# Patient Record
Sex: Female | Born: 1945 | Race: White | Hispanic: No | Marital: Married | Smoking: Never smoker
Health system: Southern US, Community
[De-identification: ages and names within clinical notes are randomized; demographics above are authoritative.]

## PROBLEM LIST (undated history)

## (undated) DIAGNOSIS — E7800 Pure hypercholesterolemia, unspecified: Secondary | ICD-10-CM

## (undated) DIAGNOSIS — I10 Essential (primary) hypertension: Secondary | ICD-10-CM

## (undated) HISTORY — PX: TUBAL LIGATION: SHX77

## (undated) HISTORY — PX: CHOLECYSTECTOMY: SHX55

## (undated) HISTORY — PX: EYE SURGERY: SHX253

## (undated) HISTORY — PX: KNEE ARTHROSCOPY: SUR90

---

## 1998-09-27 ENCOUNTER — Encounter: Payer: Self-pay | Admitting: Obstetrics & Gynecology

## 1998-09-27 ENCOUNTER — Ambulatory Visit (HOSPITAL_COMMUNITY): Admission: RE | Admit: 1998-09-27 | Discharge: 1998-09-27 | Payer: Self-pay | Admitting: Obstetrics & Gynecology

## 2000-10-18 ENCOUNTER — Encounter: Payer: Self-pay | Admitting: Family Medicine

## 2000-10-18 ENCOUNTER — Ambulatory Visit (HOSPITAL_COMMUNITY): Admission: RE | Admit: 2000-10-18 | Discharge: 2000-10-18 | Payer: Self-pay | Admitting: Family Medicine

## 2001-08-20 ENCOUNTER — Encounter (INDEPENDENT_AMBULATORY_CARE_PROVIDER_SITE_OTHER): Payer: Self-pay | Admitting: Specialist

## 2001-08-20 ENCOUNTER — Ambulatory Visit (HOSPITAL_COMMUNITY): Admission: RE | Admit: 2001-08-20 | Discharge: 2001-08-20 | Payer: Self-pay | Admitting: Gastroenterology

## 2002-10-28 ENCOUNTER — Encounter: Payer: Self-pay | Admitting: Family Medicine

## 2002-10-28 ENCOUNTER — Ambulatory Visit (HOSPITAL_COMMUNITY): Admission: RE | Admit: 2002-10-28 | Discharge: 2002-10-28 | Payer: Self-pay | Admitting: Family Medicine

## 2004-06-21 ENCOUNTER — Ambulatory Visit: Payer: Self-pay | Admitting: Family Medicine

## 2004-08-11 ENCOUNTER — Ambulatory Visit: Payer: Self-pay | Admitting: Family Medicine

## 2004-09-27 ENCOUNTER — Ambulatory Visit: Payer: Self-pay | Admitting: Family Medicine

## 2004-11-08 ENCOUNTER — Ambulatory Visit (HOSPITAL_COMMUNITY): Admission: RE | Admit: 2004-11-08 | Discharge: 2004-11-08 | Payer: Self-pay

## 2004-12-06 ENCOUNTER — Ambulatory Visit: Payer: Self-pay | Admitting: Family Medicine

## 2004-12-08 ENCOUNTER — Ambulatory Visit (HOSPITAL_COMMUNITY): Admission: RE | Admit: 2004-12-08 | Discharge: 2004-12-08 | Payer: Self-pay | Admitting: Family Medicine

## 2005-04-20 ENCOUNTER — Ambulatory Visit: Payer: Self-pay | Admitting: Family Medicine

## 2005-11-07 ENCOUNTER — Ambulatory Visit: Payer: Self-pay | Admitting: Family Medicine

## 2006-01-11 ENCOUNTER — Ambulatory Visit (HOSPITAL_COMMUNITY): Admission: RE | Admit: 2006-01-11 | Discharge: 2006-01-11 | Payer: Self-pay | Admitting: Family Medicine

## 2006-03-13 ENCOUNTER — Ambulatory Visit: Payer: Self-pay | Admitting: Family Medicine

## 2006-05-15 ENCOUNTER — Ambulatory Visit: Payer: Self-pay | Admitting: Family Medicine

## 2006-07-24 ENCOUNTER — Ambulatory Visit: Payer: Self-pay | Admitting: Family Medicine

## 2006-08-06 ENCOUNTER — Ambulatory Visit (HOSPITAL_COMMUNITY): Admission: RE | Admit: 2006-08-06 | Discharge: 2006-08-06 | Payer: Self-pay | Admitting: Family Medicine

## 2006-09-19 ENCOUNTER — Ambulatory Visit: Payer: Self-pay | Admitting: Family Medicine

## 2007-02-11 ENCOUNTER — Ambulatory Visit (HOSPITAL_COMMUNITY): Admission: RE | Admit: 2007-02-11 | Discharge: 2007-02-11 | Payer: Self-pay | Admitting: Family Medicine

## 2008-03-02 ENCOUNTER — Ambulatory Visit (HOSPITAL_COMMUNITY): Admission: RE | Admit: 2008-03-02 | Discharge: 2008-03-02 | Payer: Self-pay | Admitting: Family Medicine

## 2010-09-22 ENCOUNTER — Other Ambulatory Visit (HOSPITAL_COMMUNITY): Payer: Self-pay | Admitting: Family Medicine

## 2010-09-22 ENCOUNTER — Ambulatory Visit (HOSPITAL_COMMUNITY)
Admission: RE | Admit: 2010-09-22 | Discharge: 2010-09-22 | Disposition: A | Discharge status: Home / Self Care | Payer: MEDICARE | Source: Ambulatory Visit | Attending: Family Medicine | Admitting: Family Medicine

## 2010-09-22 DIAGNOSIS — M6520 Calcific tendinitis, unspecified site: Secondary | ICD-10-CM | POA: Insufficient documentation

## 2010-09-22 DIAGNOSIS — R52 Pain, unspecified: Secondary | ICD-10-CM

## 2010-09-22 DIAGNOSIS — M898X9 Other specified disorders of bone, unspecified site: Secondary | ICD-10-CM | POA: Insufficient documentation

## 2010-09-22 DIAGNOSIS — M25469 Effusion, unspecified knee: Secondary | ICD-10-CM | POA: Insufficient documentation

## 2010-09-22 DIAGNOSIS — M1710 Unilateral primary osteoarthritis, unspecified knee: Secondary | ICD-10-CM | POA: Insufficient documentation

## 2010-09-28 ENCOUNTER — Other Ambulatory Visit (HOSPITAL_COMMUNITY): Payer: Self-pay | Admitting: Family Medicine

## 2010-09-28 DIAGNOSIS — M25469 Effusion, unspecified knee: Secondary | ICD-10-CM

## 2010-09-30 ENCOUNTER — Ambulatory Visit (HOSPITAL_COMMUNITY)
Admission: RE | Admit: 2010-09-30 | Discharge: 2010-09-30 | Disposition: A | Discharge status: Home / Self Care | Payer: MEDICARE | Source: Ambulatory Visit | Attending: Family Medicine | Admitting: Family Medicine

## 2010-09-30 DIAGNOSIS — M79609 Pain in unspecified limb: Secondary | ICD-10-CM | POA: Insufficient documentation

## 2010-09-30 DIAGNOSIS — X58XXXA Exposure to other specified factors, initial encounter: Secondary | ICD-10-CM | POA: Insufficient documentation

## 2010-09-30 DIAGNOSIS — IMO0002 Reserved for concepts with insufficient information to code with codable children: Secondary | ICD-10-CM | POA: Insufficient documentation

## 2010-09-30 DIAGNOSIS — M25569 Pain in unspecified knee: Secondary | ICD-10-CM | POA: Insufficient documentation

## 2010-09-30 DIAGNOSIS — M25469 Effusion, unspecified knee: Secondary | ICD-10-CM | POA: Insufficient documentation

## 2010-09-30 DIAGNOSIS — M1710 Unilateral primary osteoarthritis, unspecified knee: Secondary | ICD-10-CM | POA: Insufficient documentation

## 2010-09-30 DIAGNOSIS — M659 Unspecified synovitis and tenosynovitis, unspecified site: Secondary | ICD-10-CM | POA: Insufficient documentation

## 2010-12-02 NOTE — Procedures (Signed)
[facility]  Patient:    Megan Moses, Megan Moses Visit Number: [id number] MRN: [id number]          Service Type: END Location: ENDO Attending Physician:  Louie Bun Dictated by:   Everardo All Madilyn Fireman, M.D. Proc. Date: 08/20/01 Admit Date:  08/20/2001   CC:         Delaney Meigs, M.D.   Procedure Report  PROCEDURE:  Colonoscopy.  INDICATION FOR PROCEDURE:  Screening colonoscopy.  DESCRIPTION OF PROCEDURE:  The patient was placed in the left lateral decubitus position then placed on the pulse monitor with continuous low flow oxygen delivered by nasal cannula. She was sedated with 60 mg IV Demerol and 6 mg IV Versed. The Olympus video colonoscope was inserted into the rectum and advanced to the cecum, confirmed by transillumination at McBurneys point and visualization of the ileocecal valve and appendiceal orifice. The prep was excellent. Within the base of the cecum, there was a flat polyp approximately 8 mm in diameter and I elected to fulgurate this with the hot biopsy forceps without taking tissue. All visible elements did appear to be fulgurated. The remainder of the cecum and ascending colon appeared normal. Within the transverse colon, there was an 8 mm polyp which was fulgurated by hot biopsy slightly distal to this also thought to probably be in the transverse colon. There was a 1 cm sessile polyp removed by snare. The descending sigmoid and rectum appeared normal. The scope was then withdrawn and the patient returned to the recovery room in stable condition. The patient tolerated the procedure well and there were no immediate complications.  IMPRESSION:  Cecal and transverse colon polyps.  PLAN:  Await histology for determination of method and interval for future colon screening. Dictated by:   Everardo All Madilyn Fireman, M.D. Attending Physician:  Louie Bun DD:  08/20/01 TD:  08/21/01 Job: [id number]

## 2010-12-02 NOTE — Op Note (Signed)
NAMECATALEIA, GADE                  ACCOUNT NO.:  [id number]   MEDICAL RECORD NO.:  [id number]          PATIENT TYPE:  AMB   LOCATION:  ENDO                         FACILITY:  [facility]   PHYSICIAN:  John C. Madilyn Fireman, M.D.    DATE OF BIRTH:  1945-12-15   DATE OF PROCEDURE:  11/08/2004  DATE OF DISCHARGE:                                 OPERATIVE REPORT   PROCEDURE:  Colonoscopy.   INDICATIONS FOR PROCEDURE:  Adenomatous colon polyps three years ago.   DESCRIPTION OF PROCEDURE:  The patient was placed in the left lateral  decubitus position and placed on the pulse monitor with continuous low-flow  oxygen delivered by nasal cannula.  She was sedated with 37.5 mcg IV  fentanyl and 6 mg IV Versed.  The Olympus video colonoscope was inserted  into the rectum and advanced to the cecum, confirmed by transillumination of  McBurney's point and visualization of the ileocecal valve and appendiceal  orifice.  Prep was good.  The cecum, ascending, transverse, descending,  sigmoid, and rectum colon all appeared normal with no masses, polyps,  diverticula, or other mucosal abnormalities.  Retroflexed view of the anus  revealed no obvious internal hemorrhoids.  The scope was then withdrawn and  the patient returned to the recovery room in stable condition.  She  tolerated the procedure well and there were no immediate complications.   IMPRESSION:  Normal colonoscopy.   PLAN:  Repeat study in five years.      JCH/MEDQ  D:  11/08/2004  T:  11/08/2004  Job:  [id number]   cc:   Delaney Meigs, M.D.  [address]  [place]  [place] [address]  Fax: [phone]

## 2010-12-22 ENCOUNTER — Other Ambulatory Visit (HOSPITAL_COMMUNITY): Payer: Self-pay | Admitting: Family Medicine

## 2010-12-22 DIAGNOSIS — Z1231 Encounter for screening mammogram for malignant neoplasm of breast: Secondary | ICD-10-CM

## 2011-01-02 ENCOUNTER — Ambulatory Visit (HOSPITAL_COMMUNITY)
Admission: RE | Admit: 2011-01-02 | Discharge: 2011-01-02 | Disposition: A | Discharge status: Home / Self Care | Payer: Medicare Other | Source: Ambulatory Visit | Attending: Family Medicine | Admitting: Family Medicine

## 2011-01-02 DIAGNOSIS — Z1231 Encounter for screening mammogram for malignant neoplasm of breast: Secondary | ICD-10-CM | POA: Insufficient documentation

## 2011-07-14 ENCOUNTER — Other Ambulatory Visit (HOSPITAL_COMMUNITY): Payer: Self-pay | Admitting: Family Medicine

## 2011-07-14 DIAGNOSIS — Z1231 Encounter for screening mammogram for malignant neoplasm of breast: Secondary | ICD-10-CM

## 2011-07-25 ENCOUNTER — Ambulatory Visit (HOSPITAL_COMMUNITY): Payer: Medicare Other

## 2011-07-31 ENCOUNTER — Ambulatory Visit (HOSPITAL_COMMUNITY)
Admission: RE | Admit: 2011-07-31 | Discharge: 2011-07-31 | Disposition: A | Discharge status: Home / Self Care | Payer: Medicare Other | Source: Ambulatory Visit | Attending: Family Medicine | Admitting: Family Medicine

## 2011-07-31 DIAGNOSIS — E559 Vitamin D deficiency, unspecified: Secondary | ICD-10-CM | POA: Insufficient documentation

## 2011-07-31 DIAGNOSIS — Z13820 Encounter for screening for osteoporosis: Secondary | ICD-10-CM | POA: Insufficient documentation

## 2012-02-27 ENCOUNTER — Ambulatory Visit (HOSPITAL_COMMUNITY)
Admission: RE | Admit: 2012-02-27 | Discharge: 2012-02-27 | Disposition: A | Discharge status: Home / Self Care | Payer: Medicare Other | Source: Ambulatory Visit | Attending: Family Medicine | Admitting: Family Medicine

## 2012-02-27 DIAGNOSIS — Z1231 Encounter for screening mammogram for malignant neoplasm of breast: Secondary | ICD-10-CM | POA: Insufficient documentation

## 2013-01-14 ENCOUNTER — Other Ambulatory Visit (HOSPITAL_COMMUNITY): Payer: Self-pay | Admitting: Family Medicine

## 2013-01-14 DIAGNOSIS — M810 Age-related osteoporosis without current pathological fracture: Secondary | ICD-10-CM

## 2013-01-20 ENCOUNTER — Ambulatory Visit (HOSPITAL_COMMUNITY)
Admission: RE | Admit: 2013-01-20 | Discharge: 2013-01-20 | Disposition: A | Discharge status: Home / Self Care | Payer: Medicare Other | Source: Ambulatory Visit | Attending: Family Medicine | Admitting: Family Medicine

## 2013-01-20 DIAGNOSIS — M810 Age-related osteoporosis without current pathological fracture: Secondary | ICD-10-CM

## 2013-01-29 ENCOUNTER — Other Ambulatory Visit (HOSPITAL_COMMUNITY): Payer: Self-pay | Admitting: Family Medicine

## 2013-01-29 DIAGNOSIS — Z1231 Encounter for screening mammogram for malignant neoplasm of breast: Secondary | ICD-10-CM

## 2013-03-03 ENCOUNTER — Ambulatory Visit (HOSPITAL_COMMUNITY)
Admission: RE | Admit: 2013-03-03 | Discharge: 2013-03-03 | Disposition: A | Discharge status: Home / Self Care | Payer: Medicare Other | Source: Ambulatory Visit | Attending: Family Medicine | Admitting: Family Medicine

## 2013-03-03 DIAGNOSIS — Z1231 Encounter for screening mammogram for malignant neoplasm of breast: Secondary | ICD-10-CM | POA: Insufficient documentation

## 2014-06-24 ENCOUNTER — Other Ambulatory Visit (HOSPITAL_COMMUNITY): Payer: Self-pay | Admitting: Family Medicine

## 2014-06-24 DIAGNOSIS — Z780 Asymptomatic menopausal state: Secondary | ICD-10-CM

## 2014-06-24 DIAGNOSIS — Z1231 Encounter for screening mammogram for malignant neoplasm of breast: Secondary | ICD-10-CM

## 2014-07-23 ENCOUNTER — Ambulatory Visit (HOSPITAL_COMMUNITY)
Admission: RE | Admit: 2014-07-23 | Discharge: 2014-07-23 | Disposition: A | Discharge status: Home / Self Care | Payer: Commercial Managed Care - HMO | Source: Ambulatory Visit | Attending: Family Medicine | Admitting: Family Medicine

## 2014-07-23 DIAGNOSIS — Z1231 Encounter for screening mammogram for malignant neoplasm of breast: Secondary | ICD-10-CM | POA: Insufficient documentation

## 2014-07-23 DIAGNOSIS — Z13820 Encounter for screening for osteoporosis: Secondary | ICD-10-CM | POA: Diagnosis present

## 2014-07-23 DIAGNOSIS — Z780 Asymptomatic menopausal state: Secondary | ICD-10-CM | POA: Diagnosis not present

## 2014-07-23 DIAGNOSIS — R928 Other abnormal and inconclusive findings on diagnostic imaging of breast: Secondary | ICD-10-CM | POA: Diagnosis not present

## 2014-07-27 ENCOUNTER — Other Ambulatory Visit: Payer: Self-pay | Admitting: Family Medicine

## 2014-07-27 DIAGNOSIS — R928 Other abnormal and inconclusive findings on diagnostic imaging of breast: Secondary | ICD-10-CM

## 2014-08-10 ENCOUNTER — Ambulatory Visit
Admission: RE | Admit: 2014-08-10 | Discharge: 2014-08-10 | Disposition: A | Discharge status: Home / Self Care | Payer: Medicare HMO | Source: Ambulatory Visit | Attending: Family Medicine | Admitting: Family Medicine

## 2014-08-10 DIAGNOSIS — R928 Other abnormal and inconclusive findings on diagnostic imaging of breast: Secondary | ICD-10-CM

## 2014-12-23 ENCOUNTER — Other Ambulatory Visit: Payer: Self-pay | Admitting: Gastroenterology

## 2015-12-28 ENCOUNTER — Other Ambulatory Visit: Payer: Self-pay | Admitting: Family Medicine

## 2015-12-28 DIAGNOSIS — Z1231 Encounter for screening mammogram for malignant neoplasm of breast: Secondary | ICD-10-CM

## 2016-01-10 ENCOUNTER — Ambulatory Visit
Admission: RE | Admit: 2016-01-10 | Discharge: 2016-01-10 | Disposition: A | Discharge status: Home / Self Care | Payer: Medicare HMO | Source: Ambulatory Visit | Attending: Family Medicine | Admitting: Family Medicine

## 2016-01-10 DIAGNOSIS — Z1231 Encounter for screening mammogram for malignant neoplasm of breast: Secondary | ICD-10-CM

## 2016-12-27 ENCOUNTER — Other Ambulatory Visit: Payer: Self-pay | Admitting: Family Medicine

## 2016-12-27 DIAGNOSIS — Z1231 Encounter for screening mammogram for malignant neoplasm of breast: Secondary | ICD-10-CM

## 2017-01-02 ENCOUNTER — Other Ambulatory Visit: Payer: Self-pay | Admitting: Family Medicine

## 2017-01-02 DIAGNOSIS — E2839 Other primary ovarian failure: Secondary | ICD-10-CM

## 2017-01-10 ENCOUNTER — Ambulatory Visit
Admission: RE | Admit: 2017-01-10 | Discharge: 2017-01-10 | Disposition: A | Discharge status: Home / Self Care | Payer: Medicare HMO | Source: Ambulatory Visit | Attending: Family Medicine | Admitting: Family Medicine

## 2017-01-10 DIAGNOSIS — Z1231 Encounter for screening mammogram for malignant neoplasm of breast: Secondary | ICD-10-CM

## 2017-01-10 DIAGNOSIS — E2839 Other primary ovarian failure: Secondary | ICD-10-CM

## 2017-04-30 ENCOUNTER — Encounter (HOSPITAL_COMMUNITY): Payer: Self-pay | Admitting: Emergency Medicine

## 2017-04-30 ENCOUNTER — Emergency Department (HOSPITAL_COMMUNITY): Payer: Medicare HMO

## 2017-04-30 DIAGNOSIS — R51 Headache: Secondary | ICD-10-CM | POA: Insufficient documentation

## 2017-04-30 DIAGNOSIS — R0789 Other chest pain: Secondary | ICD-10-CM | POA: Diagnosis not present

## 2017-04-30 DIAGNOSIS — I10 Essential (primary) hypertension: Secondary | ICD-10-CM | POA: Insufficient documentation

## 2017-04-30 LAB — COMPREHENSIVE METABOLIC PANEL
ALBUMIN: 4.1 g/dL (ref 3.5–5.0)
ALK PHOS: 74 U/L (ref 38–126)
ALT: 21 U/L (ref 14–54)
ANION GAP: 10 (ref 5–15)
AST: 30 U/L (ref 15–41)
BUN: 8 mg/dL (ref 6–20)
CALCIUM: 9.7 mg/dL (ref 8.9–10.3)
CHLORIDE: 104 mmol/L (ref 101–111)
CO2: 26 mmol/L (ref 22–32)
Creatinine, Ser: 0.83 mg/dL (ref 0.44–1.00)
GFR calc non Af Amer: >60 mL/min (ref >60)
GLUCOSE: 121 mg/dL — AB (ref 65–99)
POTASSIUM: 3.4 mmol/L — AB (ref 3.5–5.1)
SODIUM: 140 mmol/L (ref 135–145)
Total Bilirubin: 0.9 mg/dL (ref 0.3–1.2)
Total Protein: 6.9 g/dL (ref 6.5–8.1)

## 2017-04-30 LAB — CBC WITH DIFFERENTIAL/PLATELET
BASOS PCT: 0 %
Basophils Absolute: 0 10*3/uL (ref 0.0–0.1)
EOS ABS: 0.2 10*3/uL (ref 0.0–0.7)
EOS PCT: 2 %
HCT: 43.4 % (ref 36.0–46.0)
Hemoglobin: 13.9 g/dL (ref 12.0–15.0)
LYMPHS ABS: 4.2 10*3/uL — AB (ref 0.7–4.0)
Lymphocytes Relative: 40 %
MCH: 29.3 pg (ref 26.0–34.0)
MCHC: 32 g/dL (ref 30.0–36.0)
MCV: 91.4 fL (ref 78.0–100.0)
MONO ABS: 0.8 10*3/uL (ref 0.1–1.0)
MONOS PCT: 7 %
NEUTROS PCT: 51 %
Neutro Abs: 5.5 10*3/uL (ref 1.7–7.7)
PLATELETS: 288 10*3/uL (ref 150–400)
RBC: 4.75 MIL/uL (ref 3.87–5.11)
RDW: 13.4 % (ref 11.5–15.5)
WBC: 10.7 10*3/uL — ABNORMAL HIGH (ref 4.0–10.5)

## 2017-04-30 LAB — I-STAT TROPONIN, ED: Troponin i, poc: 0 ng/mL (ref 0.00–0.08)

## 2017-04-30 NOTE — ED Triage Notes (Signed)
Pt c/o HTN  St's she was seen today by her MD and was told to take 2 B/P meds which she did.  Pt also c/o chest feeling tight.

## 2017-05-01 ENCOUNTER — Emergency Department (HOSPITAL_COMMUNITY)
Admission: EM | Admit: 2017-05-01 | Discharge: 2017-05-01 | Disposition: A | Discharge status: Home / Self Care | Payer: Medicare HMO | Attending: Emergency Medicine | Admitting: Emergency Medicine

## 2017-05-01 DIAGNOSIS — I10 Essential (primary) hypertension: Secondary | ICD-10-CM

## 2017-05-01 HISTORY — DX: Pure hypercholesterolemia, unspecified: E78.00

## 2017-05-01 NOTE — Discharge Instructions (Signed)
As we discussed, your lab work today look good. Try not to repeatedly check your blood pressure during the day. Check once or twice daily, preferably at the same time. Follow-up closely with your primary care doctor. Return here for any new or worsening symptoms.

## 2017-05-01 NOTE — ED Provider Notes (Signed)
Patient presented to the ER with hypertension. Patient recently diagnosed with hypertension by her primary doctor, started on Norvasc and hydrochlorothiazide. She was seen yesterday with persistent blood pressure issues, her doctor doubled her Norvasc. Tonight she had elevated blood pressure once again, 230 systolic at home. She is very anxious.  Face to face Exam: HEENT - PERRLA Lungs - CTAB Heart - RRR, no M/R/G Abd - S/NT/ND Neuro - alert, oriented x3  Plan: Workup unremarkable. Blood pressure somewhat improved. Patient reassured, likely an anxiety component as well as essential hypertension. She will take her medications as prescribed, check her blood pressure only 1 or 2 times a day and has follow-up scheduled. She will follow up with primary doctor by phone in the morning.   Gilda Crease, MD 05/01/17 [phone]

## 2017-05-01 NOTE — ED Provider Notes (Signed)
[facility] EMERGENCY DEPARTMENT Provider Note   CSN: [id number] Arrival date & time: 04/30/17  2136     History   Chief Complaint No chief complaint on file.   HPI Megan Moses is a 71 y.o. female.  The history is provided by the patient and medical records.   71 year old female with history of hyperlipidemia, and newly diagnosed hypertension 4 days ago, presenting to the ED with elevated blood pressure. Patient reports last week at work she had a coworker check her blood pressure and noticed that it was in the 200s systolic. She called her doctor who encouraged her to come to the clinic. She was started on Norvasc 2.5mg .  States she's been checking her blood pressure several times a day, but feels like it was not coming down. She called her doctor again yesterday and was seen again in the clinic and told to double her dose of Norvasc. States she did this and continue checking her blood pressure always concerned when it was in going down so she wanted to be evaluated. States she does have a headache but denies any dizziness or confusion. No blurred vision. States she did notice some tightness in her chest, but has noticed that her chest is tender to palpation as well.  No fever/chills/cough.  No cardiac history.  She is not a smoker.  Patient does admit to being under some stress recently as she is the primary caregiver for her husband who has bipolar disorder and vascular dementia.  Past Medical History:  Diagnosis Date  . High cholesterol     There are no active problems to display for this patient.   Past Surgical History:  Procedure Laterality Date  . CHOLECYSTECTOMY    . EYE SURGERY    . TUBAL LIGATION      OB History    No data available       Home Medications    Prior to Admission medications   Not on File    Family History Family History  Problem Relation Age of Onset  . Breast cancer Sister     Social History Social History    Substance Use Topics  . Smoking status: Never Smoker  . Smokeless tobacco: Never Used  . Alcohol use No     Allergies   Patient has no allergy information on record.   Review of Systems Review of Systems  Respiratory: Positive for chest tightness.   Neurological: Positive for headaches.  All other systems reviewed and are negative.    Physical Exam Updated Vital Signs BP (!) 182/71 (BP Location: Left Arm)   Pulse 75   Temp 98.3 F (36.8 C) (Oral)   Resp 16   Ht 5' 3.5" (1.613 m)   Wt 77.6 kg (171 lb)   SpO2 97%   BMI 29.82 kg/m   Physical Exam  Constitutional: She is oriented to person, place, and time. She appears well-developed and well-nourished. No distress.  HENT:  Head: Normocephalic and atraumatic.  Right Ear: External ear normal.  Left Ear: External ear normal.  Eyes: Pupils are equal, round, and reactive to light. Conjunctivae and EOM are normal.  Neck: Normal range of motion and full passive range of motion without pain. Neck supple. No neck rigidity.  No rigidity, no meningismus  Cardiovascular: Normal rate, regular rhythm and normal heart sounds.   No murmur heard. Pulmonary/Chest: Effort normal and breath sounds normal. No respiratory distress. She has no wheezes. She has no rhonchi.  Abdominal:  Soft. Bowel sounds are normal. There is no tenderness. There is no guarding.  Musculoskeletal: Normal range of motion. She exhibits no edema.  Neurological: She is alert and oriented to person, place, and time. She has normal strength. She displays no tremor. No cranial nerve deficit or sensory deficit. She displays no seizure activity.  AAOx3, answering questions and following commands appropriately; equal strength UE and LE bilaterally; CN grossly intact; moves all extremities appropriately without ataxia; no focal neuro deficits or facial asymmetry appreciated  Skin: Skin is warm and dry. No rash noted. She is not diaphoretic.  Psychiatric: She has a normal  mood and affect. Her behavior is normal. Thought content normal.  Nursing note and vitals reviewed.    ED Treatments / Results  Labs (all labs ordered are listed, but only abnormal results are displayed) Labs Reviewed  CBC WITH DIFFERENTIAL/PLATELET - Abnormal; Notable for the following:       Result Value   WBC 10.7 (*)    Lymphs Abs 4.2 (*)    All other components within normal limits  COMPREHENSIVE METABOLIC PANEL - Abnormal; Notable for the following:    Potassium 3.4 (*)    Glucose, Bld 121 (*)    All other components within normal limits  I-STAT TROPONIN, ED    EKG  EKG Interpretation None       Radiology Dg Chest 2 View  Result Date: 04/30/2017 CLINICAL DATA:  71 y/o F; 5 days of high blood pressure and chest tightness. EXAM: CHEST  2 VIEW COMPARISON:  None. FINDINGS: The heart size and mediastinal contours are within normal limits. Both lungs are clear. The visualized skeletal structures are unremarkable. Right upper quadrant cholecystectomy surgical clips. IMPRESSION: No acute pulmonary process identified. Electronically Signed   By: Mitzi Hansen M.D.   On: 04/30/2017 22:36    Procedures Procedures (including critical care time)  Medications Ordered in ED Medications - No data to display   Initial Impression / Assessment and Plan / ED Course  I have reviewed the triage vital signs and the nursing notes.  Pertinent labs & imaging results that were available during my care of the patient were reviewed by me and considered in my medical decision making (see chart for details).  71 y.o. F here with HTN.  Seen by PCP x2 recently, started on new BP meds 4 days ago with dose increased yesterday.  She is hypertensive here but non-toxic in appearance.  No focal neurologic deficits.  Work-up here including labs, CXR, EKG overall reassuring.  No signs of end organ damage.  Discussed with aptient that it will likely take some time to for her body to adjust to  the meds, may need to be slowly titrated up.  She has been compulsively checking her BP at home-- recommended maximum checking 2x daily, preferably at the same time everyday.  She also expresses some anxiety/stress related symptoms which may be playing a role.  Will have her follow-up closely with her PCP.  Discussed plan with patient, he/she acknowledged understanding and agreed with plan of care.  Return precautions given for new or worsening symptoms.  Patient seen and evaluated with attending physician, Dr. Blinda Leatherwood, who agrees with assessment and plan of care.  Final Clinical Impressions(s) / ED Diagnoses   Final diagnoses:  Essential hypertension    New Prescriptions There are no discharge medications for this patient.    Garlon Hatchet, PA-C 05/01/17 1610    Gilda Crease, MD 05/01/17 [phone]

## 2017-06-27 ENCOUNTER — Other Ambulatory Visit: Payer: Self-pay

## 2017-06-27 ENCOUNTER — Emergency Department (HOSPITAL_COMMUNITY): Payer: Medicare HMO

## 2017-06-27 ENCOUNTER — Encounter (HOSPITAL_COMMUNITY): Payer: Self-pay | Admitting: *Deleted

## 2017-06-27 ENCOUNTER — Emergency Department (HOSPITAL_COMMUNITY)
Admission: EM | Admit: 2017-06-27 | Discharge: 2017-06-27 | Disposition: A | Discharge status: Home / Self Care | Payer: Medicare HMO | Attending: Emergency Medicine | Admitting: Emergency Medicine

## 2017-06-27 DIAGNOSIS — I10 Essential (primary) hypertension: Secondary | ICD-10-CM | POA: Diagnosis not present

## 2017-06-27 DIAGNOSIS — M1711 Unilateral primary osteoarthritis, right knee: Secondary | ICD-10-CM | POA: Diagnosis not present

## 2017-06-27 DIAGNOSIS — M25561 Pain in right knee: Secondary | ICD-10-CM | POA: Diagnosis present

## 2017-06-27 HISTORY — DX: Essential (primary) hypertension: I10

## 2017-06-27 MED ORDER — TRAMADOL HCL 50 MG PO TABS: ORAL_TABLET | ORAL | 0 refills | Status: DC

## 2017-06-27 MED ORDER — DICLOFENAC SODIUM 1 % TD GEL: TRANSDERMAL | 1 refills | Status: AC

## 2017-06-27 NOTE — ED Notes (Signed)
Pt back from x-ray.

## 2017-06-27 NOTE — ED Triage Notes (Signed)
Right knee pain

## 2017-06-27 NOTE — Discharge Instructions (Signed)
Your vital signs are within normal limits.  Your x-ray shows multiple areas of arthritis involving your knee.  There is no fluid or effusion in the joint.  Please use your knee sleeve when up and about.  Please use a cane or walker until seen by orthopedics to give you added support.  Please apply Voltaren to the knee 3 times daily.  Use Tylenol every 4 hours, use Ultram for more severe pain if needed.  Ultram may cause drowsiness, please use this medication with caution.  Please see Dr. Romeo AppleHarrison in the office for orthopedic evaluation and management of your knee.

## 2017-06-27 NOTE — ED Provider Notes (Signed)
[facility] EMERGENCY DEPARTMENT Provider Note   CSN: [id number] Arrival date & time: 06/27/17  1033     History   Chief Complaint Chief Complaint  Patient presents with  . Knee Pain    HPI Megan Moses is a 71 y.o. female.  The history is provided by the patient.  Knee Pain   This is a chronic (acute on chronic) problem. The problem occurs hourly. The problem has been gradually worsening. The pain is present in the right knee. The quality of the pain is described as aching. The pain is moderate. Associated symptoms include limited range of motion. Associated symptoms comments: Can't bend the right knee without severe pain. She can straighten the knee, but not bend the knee.. Exacerbated by: bending. She has tried cold for the symptoms. The treatment provided no relief. There has been no history of extremity trauma.    Past Medical History:  Diagnosis Date  . High cholesterol   . Hypertension     There are no active problems to display for this patient.   Past Surgical History:  Procedure Laterality Date  . CHOLECYSTECTOMY    . EYE SURGERY    . TUBAL LIGATION      OB History    No data available       Home Medications    Prior to Admission medications   Not on File    Family History Family History  Problem Relation Age of Onset  . Breast cancer Sister     Social History Social History   Tobacco Use  . Smoking status: Never Smoker  . Smokeless tobacco: Never Used  Substance Use Topics  . Alcohol use: No  . Drug use: No     Allergies   Sulfa antibiotics   Review of Systems Review of Systems  Constitutional: Negative for activity change.       All ROS Neg except as noted in HPI  HENT: Negative for nosebleeds.   Eyes: Negative for photophobia and discharge.  Respiratory: Negative for cough, shortness of breath and wheezing.   Cardiovascular: Negative for chest pain and palpitations.  Gastrointestinal: Negative for abdominal pain and blood  in stool.  Genitourinary: Negative for dysuria, frequency and hematuria.  Musculoskeletal: Positive for arthralgias. Negative for back pain and neck pain.  Skin: Negative.   Neurological: Negative for dizziness, seizures and speech difficulty.  Psychiatric/Behavioral: Negative for confusion and hallucinations.     Physical Exam Updated Vital Signs BP 133/73   Pulse 79   Temp 98.3 F (36.8 C)   Resp 20   Ht 5\' 3"  (1.6 m)   Wt 75.8 kg (167 lb)   SpO2 98%   BMI 29.58 kg/m   Physical Exam  Constitutional: She is oriented to person, place, and time. She appears well-developed and well-nourished.  Non-toxic appearance.  HENT:  Head: Normocephalic.  Right Ear: Tympanic membrane and external ear normal.  Left Ear: Tympanic membrane and external ear normal.  Eyes: EOM and lids are normal. Pupils are equal, round, and reactive to light.  Neck: Normal range of motion. Neck supple. Carotid bruit is not present.  Cardiovascular: Normal rate, regular rhythm, normal heart sounds, intact distal pulses and normal pulses.  Pulmonary/Chest: Breath sounds normal. No respiratory distress.  Abdominal: Soft. Bowel sounds are normal. There is no tenderness. There is no guarding.  Musculoskeletal:       Right knee: She exhibits decreased range of motion. She exhibits no deformity, no erythema and normal patellar  mobility. Tenderness found. Medial joint line tenderness noted.  Lymphadenopathy:       Head (right side): No submandibular adenopathy present.       Head (left side): No submandibular adenopathy present.    She has no cervical adenopathy.  Neurological: She is alert and oriented to person, place, and time. She has normal strength. No cranial nerve deficit or sensory deficit.  Skin: Skin is warm and dry.  Psychiatric: She has a normal mood and affect. Her speech is normal.  Nursing note and vitals reviewed.    ED Treatments / Results  Labs (all labs ordered are listed, but only  abnormal results are displayed) Labs Reviewed - No data to display  EKG  EKG Interpretation None       Radiology No results found.  Procedures Procedures (including critical care time)  Medications Ordered in ED Medications - No data to display   Initial Impression / Assessment and Plan / ED Course  I have reviewed the triage vital signs and the nursing notes.  Pertinent labs & imaging results that were available during my care of the patient were reviewed by me and considered in my medical decision making (see chart for details).       Final Clinical Impressions(s) / ED Diagnoses MDM Vital signs reviewed.  The patient's right knee is most painful with bending.  Seems to be better with straightening the knee out.  There is no effusion noted on the examination and no evidence of a hot knee. X-ray of the right knee shows osteophytes of multiple sites.  There is also joint space narrowing present.  I suspect that the pain is related to these findings.  The patient is fitted with a knee sleeve.  The patient will be treated with Voltaren gel and Ultram.  I have asked the patient to see Dr. Romeo AppleHarrison for orthopedic management as soon as possible.  I have also asked the patient to use a walker for stability until she can see Dr. Romeo AppleHarrison.   Final diagnoses:  Primary osteoarthritis of right knee    ED Discharge Orders        Ordered    diclofenac sodium (VOLTAREN) 1 % GEL     06/27/17 1319    traMADol (ULTRAM) 50 MG tablet     06/27/17 1319       Ivery QualeBryant, Chloris Marcoux, PA-C 06/27/17 1938    Samuel JesterMcManus, Kathleen, DO 06/29/17 1735

## 2017-06-27 NOTE — ED Notes (Signed)
Pt to xray

## 2017-07-04 ENCOUNTER — Encounter: Payer: Self-pay | Admitting: Orthopedic Surgery

## 2017-07-04 ENCOUNTER — Ambulatory Visit: Payer: Medicare HMO | Admitting: Orthopedic Surgery

## 2017-07-04 VITALS — BP 94/65 | HR 64 | Ht 63.0 in | Wt 167.0 lb

## 2017-07-04 DIAGNOSIS — M170 Bilateral primary osteoarthritis of knee: Secondary | ICD-10-CM

## 2017-07-04 MED ORDER — DICLOFENAC SODIUM 1 % TD GEL: 4.0000 g | Freq: Four times a day (QID) | TRANSDERMAL | 3 refills | Status: DC

## 2017-07-04 NOTE — Progress Notes (Signed)
Patient ID: Megan Moses, female   DOB: 1946-03-13, 71 y.o.   MRN: [id number]  Chief Complaint  Patient presents with  . Knee Pain    ER follow up Right knee pain since 06/26/17    71 year old female was putting her boot on and developed acute right knee pain.  She was in to see her doctor who sent her to the ER x-rays showed arthritis of the knee no acute fracture.  Pain was on the medial joint it was associated with decreased range of motion painful weightbearing.  She describes dull aching pain without catching locking or giving way.  She had pain for about 2-3 days and then it resolved using Voltaren gel.  She had an arthroscopy of the left knee with microfracture in 2012.  She said she was told she had bone-on-bone changes in the microfracture was done as a cartilage stimulation procedure and she has had minimal symptoms from that knee until the right knee started to hurt.  She had transfer pain onto the left knee which is better now that the right knee pain is improving    Review of Systems  Respiratory: Negative.   Cardiovascular: Negative.   Musculoskeletal: Negative for back pain.       Right leg shin pain currently resolved  Neurological: Negative for tingling.    Past Medical History:  Diagnosis Date  . High cholesterol   . Hypertension     Past Surgical History:  Procedure Laterality Date  . CHOLECYSTECTOMY    . EYE SURGERY    . TUBAL LIGATION      PHYSICAL EXAM  BP 94/65   Pulse 64   Ht 5\' 3"  (1.6 m)   Wt 167 lb (75.8 kg)   BMI 29.58 kg/m  GENERAL appearance reveals no gross abnormalities, normal development grooming and hygiene   MENTAL STATUS we note that the patient is awake alert and oriented to person place and time MOOD/AFFECT ARE NORMAL   GAIT reveals no  limp in the effected limb  Right Knee Exam   Muscle Strength  The patient has normal right knee strength.  Tenderness  The patient is experiencing tenderness in the medial joint  line.  Range of Motion  Extension: normal  Flexion: normal   Tests  McMurray:  Medial - negative Lateral - negative Varus: negative Valgus: negative Drawer:  Anterior - negative    Posterior - negative  Other  Erythema: absent Scars: absent Sensation: normal Pulse: present Swelling: none Effusion: no effusion present   Left Knee Exam   Muscle Strength  The patient has normal left knee strength.  Tenderness  The patient is experiencing tenderness in the medial joint line.  Range of Motion  Extension: normal  Flexion: normal Left knee flexion: 125 degrees.   Tests  McMurray:  Medial - negative Lateral - negative Varus: negative Valgus: negative Drawer:  Anterior - negative     Posterior - negative  Other  Erythema: absent Scars: absent Sensation: normal Pulse: present Swelling: none Effusion: no effusion present     VASC 2+ dorsalis pedis pulse normal capillary refill excellent warmth to the extremity  NEURO normal sensation and no pathologic reflexes  LYMPH deferred noncontributory   IMAGING STUDIES  I have independently reviewed the x-rays and I interpreted the x-rays as follows:  Right knee shows moderate amount of arthritis medial compartment alignment is normal.  Left knee from 2012 also shows arthritis medial compartment.  Dx   primary osteoarthritis of the  right and left knee  PLAN continue diclofenac cream call if any increase in symptoms  9:12 AM Fuller CanadaStanley Jaycee Mckellips, MD 07/04/2017

## 2017-09-10 ENCOUNTER — Ambulatory Visit: Payer: Medicare HMO | Admitting: Orthopedic Surgery

## 2017-09-10 ENCOUNTER — Encounter: Payer: Self-pay | Admitting: Orthopedic Surgery

## 2017-09-10 VITALS — BP 128/78 | HR 78 | Ht 63.0 in | Wt 168.0 lb

## 2017-09-10 DIAGNOSIS — M170 Bilateral primary osteoarthritis of knee: Secondary | ICD-10-CM

## 2017-09-10 NOTE — Progress Notes (Signed)
Chief Complaint  Patient presents with  . Knee Pain    right    Procedure note right knee injection verbal consent was obtained to inject right knee joint  Timeout was completed to confirm the site of injection  The medications used were 40 mg of Depo-Medrol and 1% lidocaine 3 cc  Anesthesia was provided by ethyl chloride and the skin was prepped with alcohol.  After cleaning the skin with alcohol a 20-gauge needle was used to inject the right knee joint. There were no complications. A sterile bandage was applied.  Encounter Diagnosis  Name Primary?  . Primary osteoarthritis of both knees Yes

## 2017-10-15 ENCOUNTER — Encounter: Payer: Self-pay | Admitting: Orthopedic Surgery

## 2017-10-15 ENCOUNTER — Ambulatory Visit (INDEPENDENT_AMBULATORY_CARE_PROVIDER_SITE_OTHER): Payer: Self-pay | Admitting: Orthopedic Surgery

## 2017-10-15 VITALS — BP 123/76 | HR 73 | Ht 63.0 in | Wt 167.0 lb

## 2017-10-15 DIAGNOSIS — M170 Bilateral primary osteoarthritis of knee: Secondary | ICD-10-CM

## 2017-10-15 NOTE — Progress Notes (Signed)
Chief Complaint  Patient presents with  . Knee Pain    right     Procedure note right knee injection verbal consent was obtained to inject right knee joint  Timeout was completed to confirm the site of injection  The medications used were 40 mg of Depo-Medrol and 1% lidocaine 3 cc  Anesthesia was provided by ethyl chloride and the skin was prepped with alcohol.  After cleaning the skin with alcohol a 20-gauge needle was used to inject the right knee joint. There were no complications. A sterile bandage was applied.  

## 2017-12-21 ENCOUNTER — Other Ambulatory Visit: Payer: Self-pay | Admitting: Family Medicine

## 2017-12-21 DIAGNOSIS — Z1231 Encounter for screening mammogram for malignant neoplasm of breast: Secondary | ICD-10-CM

## 2018-01-15 ENCOUNTER — Ambulatory Visit
Admission: RE | Admit: 2018-01-15 | Discharge: 2018-01-15 | Disposition: A | Discharge status: Home / Self Care | Payer: Medicare HMO | Source: Ambulatory Visit | Attending: Family Medicine | Admitting: Family Medicine

## 2018-01-15 DIAGNOSIS — Z1231 Encounter for screening mammogram for malignant neoplasm of breast: Secondary | ICD-10-CM

## 2018-11-27 ENCOUNTER — Telehealth: Payer: Self-pay | Admitting: Orthopedic Surgery

## 2018-11-27 NOTE — Telephone Encounter (Signed)
Patient called to request appointment for bilateral knee pain which has flared up - requests injections if possible. I offered appointment for early June due to covid-19 restrictions - patient said she is leaving town Wednesday, 12/04/18, to take care of grandchildren. Any possibility of in-person appointment before then?

## 2018-11-28 NOTE — Telephone Encounter (Signed)
See her 1140 may 18th, cancel joseph scales appointment for Dec 02 1138

## 2018-11-28 NOTE — Telephone Encounter (Signed)
Called patient to offer appointment per Dr Mort Sawyers response; reached voice mail; left message to return call.

## 2018-11-29 NOTE — Telephone Encounter (Signed)
Patient returned call; has been scheduled per Dr Mort Sawyers note. Aware of appointment.

## 2018-12-02 ENCOUNTER — Encounter: Payer: Self-pay | Admitting: Orthopedic Surgery

## 2018-12-02 ENCOUNTER — Ambulatory Visit: Payer: Medicare HMO | Admitting: Orthopedic Surgery

## 2018-12-02 ENCOUNTER — Other Ambulatory Visit: Payer: Self-pay

## 2018-12-02 VITALS — BP 151/79 | HR 75 | Temp 97.6°F | Ht 63.0 in | Wt 169.0 lb

## 2018-12-02 DIAGNOSIS — M170 Bilateral primary osteoarthritis of knee: Secondary | ICD-10-CM

## 2018-12-02 NOTE — Progress Notes (Signed)
Chief Complaint  Patient presents with  . Knee Pain    requested bilateral knee injections   Procedure note for bilateral knee injections  Procedure note left knee injection verbal consent was obtained to inject left knee joint  Timeout was completed to confirm the site of injection  The medications used were 40 mg of Depo-Medrol and 1% lidocaine 3 cc  Anesthesia was provided by ethyl chloride and the skin was prepped with alcohol.  After cleaning the skin with alcohol a 20-gauge needle was used to inject the left knee joint. There were no complications. A sterile bandage was applied.   Procedure note right knee injection verbal consent was obtained to inject right knee joint  Timeout was completed to confirm the site of injection  The medications used were 40 mg of Depo-Medrol and 1% lidocaine 3 cc  Anesthesia was provided by ethyl chloride and the skin was prepped with alcohol.  After cleaning the skin with alcohol a 20-gauge needle was used to inject the right knee joint. There were no complications. A sterile bandage was applied.  Encounter Diagnosis  Name Primary?  . Primary osteoarthritis of both knees Yes

## 2018-12-25 ENCOUNTER — Other Ambulatory Visit: Payer: Self-pay | Admitting: Family Medicine

## 2018-12-25 DIAGNOSIS — Z1231 Encounter for screening mammogram for malignant neoplasm of breast: Secondary | ICD-10-CM

## 2019-02-10 ENCOUNTER — Ambulatory Visit
Admission: RE | Admit: 2019-02-10 | Discharge: 2019-02-10 | Disposition: A | Discharge status: Home / Self Care | Payer: Medicare HMO | Source: Ambulatory Visit | Attending: Family Medicine | Admitting: Family Medicine

## 2019-02-10 ENCOUNTER — Other Ambulatory Visit: Payer: Self-pay

## 2019-02-10 DIAGNOSIS — Z1231 Encounter for screening mammogram for malignant neoplasm of breast: Secondary | ICD-10-CM

## 2020-01-21 ENCOUNTER — Other Ambulatory Visit: Payer: Self-pay | Admitting: Family Medicine

## 2020-01-21 DIAGNOSIS — Z1231 Encounter for screening mammogram for malignant neoplasm of breast: Secondary | ICD-10-CM

## 2020-02-13 ENCOUNTER — Other Ambulatory Visit: Payer: Self-pay

## 2020-02-13 ENCOUNTER — Ambulatory Visit
Admission: RE | Admit: 2020-02-13 | Discharge: 2020-02-13 | Disposition: A | Discharge status: Home / Self Care | Payer: Medicare HMO | Source: Ambulatory Visit | Attending: Family Medicine | Admitting: Family Medicine

## 2020-02-13 DIAGNOSIS — Z1231 Encounter for screening mammogram for malignant neoplasm of breast: Secondary | ICD-10-CM

## 2020-05-25 ENCOUNTER — Encounter: Payer: Self-pay | Admitting: Orthopaedic Surgery

## 2020-05-25 ENCOUNTER — Ambulatory Visit (INDEPENDENT_AMBULATORY_CARE_PROVIDER_SITE_OTHER): Payer: Medicare HMO | Admitting: Orthopaedic Surgery

## 2020-05-25 ENCOUNTER — Ambulatory Visit (INDEPENDENT_AMBULATORY_CARE_PROVIDER_SITE_OTHER): Payer: Medicare HMO

## 2020-05-25 ENCOUNTER — Other Ambulatory Visit: Payer: Self-pay

## 2020-05-25 ENCOUNTER — Ambulatory Visit: Payer: Self-pay

## 2020-05-25 VITALS — BP 145/79 | HR 80 | Ht 63.0 in | Wt 165.0 lb

## 2020-05-25 DIAGNOSIS — M25561 Pain in right knee: Secondary | ICD-10-CM

## 2020-05-25 DIAGNOSIS — M25562 Pain in left knee: Secondary | ICD-10-CM | POA: Diagnosis not present

## 2020-05-25 DIAGNOSIS — G8929 Other chronic pain: Secondary | ICD-10-CM | POA: Diagnosis not present

## 2020-05-25 NOTE — Progress Notes (Signed)
Office Visit Note   Patient: Megan Moses           Date of Birth: 01-11-1946           MRN: [id number] Visit Date: 05/25/2020              Requested by: Vivien Presto, MD [phone] [address] [place],  [place] [address] PCP: Vivien Presto, MD   Assessment & Plan: Visit Diagnoses:  1. Chronic pain of both knees     Plan: Bilateral knee injection performed which she tolerated well.  She can return on an as-needed basis.  Follow-Up Instructions: No follow-ups on file.   Orders:  Orders Placed This Encounter  Procedures  . XR Knee 1-2 Views Right  . XR KNEE 3 VIEW LEFT   No orders of the defined types were placed in this encounter.     Procedures: Large Joint Inj: R knee on 05/30/2020 5:11 PM Indications: pain and joint swelling Details: 22 G 1.5 in needle, anterolateral approach  Arthrogram: No  Medications: 40 mg methylPREDNISolone acetate 40 MG/ML; 0.5 mL lidocaine 1 %; 4 mL bupivacaine 0.25 % Outcome: tolerated well, no immediate complications Procedure, treatment alternatives, risks and benefits explained, specific risks discussed. Consent was given by the patient. Immediately prior to procedure a time out was called to verify the correct patient, procedure, equipment, support staff and site/side marked as required. Patient was prepped and draped in the usual sterile fashion.   Large Joint Inj: L knee on 05/30/2020 5:12 PM Indications: joint swelling and pain Details: 22 G 1.5 in needle, anterolateral approach  Arthrogram: No  Medications: 0.5 mL lidocaine 1 %; 3 mL bupivacaine 0.5 %; 40 mg methylPREDNISolone acetate 40 MG/ML Outcome: tolerated well, no immediate complications Procedure, treatment alternatives, risks and benefits explained, specific risks discussed. Consent was given by the patient. Immediately prior to procedure a time out was called to verify the correct patient, procedure, equipment, support staff and site/side marked as required.  Patient was prepped and draped in the usual sterile fashion.       Clinical Data: No additional findings.   Subjective: Chief Complaint  Patient presents with  . Right Knee - Pain  . Left Knee - Pain    HPI 74 year old female returns she had previous injections in her knee in April is requesting repeat injections.  She got many months of relief.  She states with the weather is getting colder she has had increased difficulty walking she is using forward Taryn gel on a daily basis.  Pain is kept her from falling asleep and sometimes been waking up in the middle the night.  Recently her husband passed away she has had to do yard work now which puts more demands on her knees and she requests repeat injections.  Previous knee arthroscopy in the left knee 2013 with debridement.  Patient has used Mobic.  Review of Systems all other systems are noncontributory to HPI.   Objective: Vital Signs: BP (!) 145/79   Pulse 80   Ht 5\' 3"  (1.6 m)   Wt 165 lb (74.8 kg)   BMI 29.23 kg/m   Physical Exam Constitutional:      Appearance: She is well-developed.  HENT:     Head: Normocephalic.     Right Ear: External ear normal.     Left Ear: External ear normal.  Eyes:     Pupils: Pupils are equal, round, and reactive to light.  Neck:  Thyroid: No thyromegaly.     Trachea: No tracheal deviation.  Cardiovascular:     Rate and Rhythm: Normal rate.  Pulmonary:     Effort: Pulmonary effort is normal.  Abdominal:     Palpations: Abdomen is soft.  Skin:    General: Skin is warm and dry.  Neurological:     Mental Status: She is alert and oriented to person, place, and time.  Psychiatric:        Behavior: Behavior normal.     Ortho Exam bilateral knee crepitus collateral ligaments are stable.  Negative hip range of motion.  Knee and ankle jerk are intact.  Crepitus with knee flexion extension.  Well-healed left knee arthroscopic portals well-healed.  Specialty Comments:  No specialty  comments available.  Imaging: No results found.   PMFS History: There are no problems to display for this patient.  Past Medical History:  Diagnosis Date  . High cholesterol   . Hypertension     Family History  Problem Relation Age of Onset  . Breast cancer Sister     Past Surgical History:  Procedure Laterality Date  . CHOLECYSTECTOMY    . EYE SURGERY    . TUBAL LIGATION     Social History   Occupational History  . Not on file  Tobacco Use  . Smoking status: Never Smoker  . Smokeless tobacco: Never Used  Substance and Sexual Activity  . Alcohol use: No  . Drug use: No  . Sexual activity: Not on file

## 2020-05-30 DIAGNOSIS — M25561 Pain in right knee: Secondary | ICD-10-CM | POA: Diagnosis not present

## 2020-05-30 DIAGNOSIS — M25562 Pain in left knee: Secondary | ICD-10-CM | POA: Diagnosis not present

## 2020-05-30 DIAGNOSIS — G8929 Other chronic pain: Secondary | ICD-10-CM | POA: Diagnosis not present

## 2020-05-30 MED ORDER — BUPIVACAINE HCL 0.5 % IJ SOLN
3.0000 mL | INTRAMUSCULAR | Status: AC | PRN
  Administered 2020-05-30: 3 mL via INTRA_ARTICULAR

## 2020-05-30 MED ORDER — BUPIVACAINE HCL 0.25 % IJ SOLN
4.0000 mL | INTRAMUSCULAR | Status: AC | PRN
Start: 2020-05-30 — End: 2020-05-30
  Administered 2020-05-30: 4 mL via INTRA_ARTICULAR

## 2020-05-30 MED ORDER — METHYLPREDNISOLONE ACETATE 40 MG/ML IJ SUSP
40.0000 mg | INTRAMUSCULAR | Status: AC | PRN
Start: 2020-05-30 — End: 2020-05-30
  Administered 2020-05-30: 40 mg via INTRA_ARTICULAR

## 2020-05-30 MED ORDER — METHYLPREDNISOLONE ACETATE 40 MG/ML IJ SUSP
40.0000 mg | INTRAMUSCULAR | Status: AC | PRN
  Administered 2020-05-30: 40 mg via INTRA_ARTICULAR

## 2020-05-30 MED ORDER — LIDOCAINE HCL 1 % IJ SOLN
0.5000 mL | INTRAMUSCULAR | Status: AC | PRN
  Administered 2020-05-30: .5 mL

## 2020-10-15 ENCOUNTER — Other Ambulatory Visit: Payer: Self-pay | Admitting: Otolaryngology

## 2020-10-15 DIAGNOSIS — R221 Localized swelling, mass and lump, neck: Secondary | ICD-10-CM

## 2020-11-02 ENCOUNTER — Ambulatory Visit
Admission: RE | Admit: 2020-11-02 | Discharge: 2020-11-02 | Disposition: A | Discharge status: Home / Self Care | Payer: Medicare HMO | Source: Ambulatory Visit | Attending: Otolaryngology | Admitting: Otolaryngology

## 2020-11-02 DIAGNOSIS — R221 Localized swelling, mass and lump, neck: Secondary | ICD-10-CM

## 2020-11-02 MED ORDER — IOPAMIDOL (ISOVUE-300) INJECTION 61%
75.0000 mL | Freq: Once | INTRAVENOUS | Status: AC | PRN
  Administered 2020-11-02: 75 mL via INTRAVENOUS

## 2020-11-09 ENCOUNTER — Other Ambulatory Visit: Payer: Self-pay | Admitting: Otolaryngology

## 2020-11-09 DIAGNOSIS — E041 Nontoxic single thyroid nodule: Secondary | ICD-10-CM

## 2020-11-16 ENCOUNTER — Ambulatory Visit
Admission: RE | Admit: 2020-11-16 | Discharge: 2020-11-16 | Disposition: A | Discharge status: Home / Self Care | Payer: Medicare HMO | Source: Ambulatory Visit | Attending: Otolaryngology | Admitting: Otolaryngology

## 2020-11-16 DIAGNOSIS — E041 Nontoxic single thyroid nodule: Secondary | ICD-10-CM

## 2021-01-13 ENCOUNTER — Encounter (HOSPITAL_BASED_OUTPATIENT_CLINIC_OR_DEPARTMENT_OTHER): Payer: Self-pay | Admitting: Otolaryngology

## 2021-01-18 ENCOUNTER — Encounter (HOSPITAL_BASED_OUTPATIENT_CLINIC_OR_DEPARTMENT_OTHER)
Admission: RE | Admit: 2021-01-18 | Discharge: 2021-01-18 | Disposition: A | Discharge status: Home / Self Care | Payer: Medicare HMO | Source: Ambulatory Visit | Attending: Otolaryngology | Admitting: Otolaryngology

## 2021-01-18 DIAGNOSIS — Z01812 Encounter for preprocedural laboratory examination: Secondary | ICD-10-CM | POA: Diagnosis present

## 2021-01-18 LAB — BASIC METABOLIC PANEL
Anion gap: 8 (ref 5–15)
BUN: 9 mg/dL (ref 8–23)
CO2: 29 mmol/L (ref 22–32)
Calcium: 9.3 mg/dL (ref 8.9–10.3)
Chloride: 100 mmol/L (ref 98–111)
Creatinine, Ser: 0.84 mg/dL (ref 0.44–1.00)
GFR, Estimated: >60 mL/min (ref >60)
Glucose, Bld: 102 mg/dL — ABNORMAL HIGH (ref 70–99)
Potassium: 4.1 mmol/L (ref 3.5–5.1)
Sodium: 137 mmol/L (ref 135–145)

## 2021-01-20 NOTE — H&P (Signed)
HPI:   Ceclia Moses is a 75 y.o. female who presents as a consult Patient.   Referring Provider: Andee Poles, MD  Chief complaint: Oral lesion.  HPI: Referred here for evaluation of an oral lesion identified by her dentist. She has partial upper and lower and the upper was not fitting well. She has had some work done on it a couple of months ago and is now fitting much better. She had some slight discomfort occasionally in some areas where the denture was rubbing but that has now gone away. She is referred here to make sure there is nothing serious in her mouth. A few nights ago she ate something that made her sick and she had strong retching and vomiting. She has a slight soreness in her neck on the right side since then.  PMH/Meds/All/SocHx/FamHx/ROS:   Past Medical History:  Diagnosis Date   Hypertension   Leakage of cardiac device   Past Surgical History:  Procedure Laterality Date   CHOLECYSTECTOMY   knee scope   TUBAL LIGATION   No family history of bleeding disorders, wound healing problems or difficulty with anesthesia.   Social History   Socioeconomic History   Marital status: Unknown  Spouse name: Not on file   Number of children: Not on file   Years of education: Not on file   Highest education level: Not on file  Occupational History   Not on file  Tobacco Use   Smoking status: Never Smoker   Smokeless tobacco: Never Used  Vaping Use   Vaping Use: Never used  Substance and Sexual Activity   Alcohol use: Not on file   Drug use: Not on file   Sexual activity: Not on file  Other Topics Concern   Not on file  Social History Narrative   Not on file   Social Determinants of Health   Financial Resource Strain: Not on file  Food Insecurity: Not on file  Transportation Needs: Not on file  Physical Activity: Not on file  Stress: Not on file  Social Connections: Not on file  Housing Stability: Not on file   Current Outpatient Medications:    amLODIPine (NORVASC) 10 MG tablet, 1 tablet, Disp: , Rfl:   aspirin 81 MG chewable tablet *ANTIPLATELET*, 1 tablet, Disp: , Rfl:   calcium-vitamin D3-vitamin K (VIACTIV) 650 mg-12.5 mcg-40 mcg chewable tablet, Take by mouth., Disp: , Rfl:   diclofenac (VOLTAREN) 1 % Gel gel, Apply topically 4 (four) times daily as needed., Disp: , Rfl:   hydroCHLOROthiazide (HYDRODIURIL) 25 MG tablet, Take 25 mg by mouth daily., Disp: , Rfl:   levocetirizine (XYZAL) 5 MG tablet, , Disp: , Rfl:   LORazepam (ATIVAN) 0.5 MG tablet, Take 0.5 mg by mouth., Disp: , Rfl:   meloxicam (MOBIC) 7.5 MG tablet, Take 7.5 mg by mouth daily., Disp: , Rfl:   omeprazole (PRILOSEC) 40 MG capsule, 1 capsule, Disp: , Rfl:   potassium chloride ER (KLOR-CON-M, K-DUR) 20 MEQ extended release tablet, , Disp: , Rfl:   pravastatin (PRAVACHOL) 20 MG tablet, 1 tablet, Disp: , Rfl:   A complete ROS was performed with pertinent positives/negatives noted in the HPI. The remainder of the ROS are negative.   Physical Exam:   BP 154/67  Pulse 79  Temp (!) 96.4 F (35.8 C)  Ht 1.6 m (5\' 3" )  Wt 74.4 kg (164 lb)  BMI 29.05 kg/m   General: Healthy and alert, in no distress, breathing easily. Normal affect. In a pleasant  mood. Head: Normocephalic, atraumatic. No masses, or scars. Eyes: Pupils are equal, and reactive to light. Vision is grossly intact. No spontaneous or gaze nystagmus. Ears: Ear canals are clear. Tympanic membranes are intact, with normal landmarks and the middle ears are clear and healthy. Hearing: Grossly normal. Nose: Nasal cavities are clear with healthy mucosa, no polyps or exudate. Airways are patent. Face: No masses or scars, facial nerve function is symmetric. Oral Cavity: There are multiple missing teeth. There is a slight mucosal thickening and erythema around the left upper premolar and canine on both sides of the teeth. No other lesions are identified. Tongue with normal mobility. Dentition appears  healthy. Oropharynx: There are no mucosal masses identified. Tongue base appears normal and healthy. Larynx/Hypopharynx: deferred Chest: Deferred Neck: There is a slightly tender palpable fullness deep in the right neck possibly coming from the paraspinous muscles, no cervical adenopathy, no thyroid nodules or enlargement. Neuro: Cranial nerves II-XII with normal function. Balance: Normal gate. Other findings: none.  Independent Review of Additional Tests or Records:  none  Procedures:  none  Impression & Plans:  Slight changes in the oral mucosa surrounding the left upper dentition. Nothing that looks obviously malignant or suspicious for malignancy. She has no symptoms related to this. Recommend recheck this area in 2 months to see if there is any change or sooner if she notices any discomfort.  Possible neck mass. This may just be a muscle pull from her recent vomiting.Recommend CT imaging to rule out anything pathologic.

## 2021-01-24 ENCOUNTER — Encounter (HOSPITAL_BASED_OUTPATIENT_CLINIC_OR_DEPARTMENT_OTHER)
Admission: RE | Disposition: A | Discharge status: Home / Self Care | Payer: Self-pay | Source: Home / Self Care | Attending: Otolaryngology

## 2021-01-24 ENCOUNTER — Ambulatory Visit (HOSPITAL_BASED_OUTPATIENT_CLINIC_OR_DEPARTMENT_OTHER)
Admission: RE | Admit: 2021-01-24 | Discharge: 2021-01-24 | Disposition: A | Discharge status: Home / Self Care | Payer: Medicare HMO | Attending: Otolaryngology | Admitting: Otolaryngology

## 2021-01-24 ENCOUNTER — Encounter (HOSPITAL_BASED_OUTPATIENT_CLINIC_OR_DEPARTMENT_OTHER): Payer: Self-pay | Admitting: Otolaryngology

## 2021-01-24 ENCOUNTER — Ambulatory Visit (HOSPITAL_BASED_OUTPATIENT_CLINIC_OR_DEPARTMENT_OTHER): Payer: Medicare HMO | Admitting: Certified Registered"

## 2021-01-24 ENCOUNTER — Other Ambulatory Visit: Payer: Self-pay

## 2021-01-24 DIAGNOSIS — Z79899 Other long term (current) drug therapy: Secondary | ICD-10-CM | POA: Diagnosis not present

## 2021-01-24 DIAGNOSIS — Z7982 Long term (current) use of aspirin: Secondary | ICD-10-CM | POA: Insufficient documentation

## 2021-01-24 DIAGNOSIS — Z791 Long term (current) use of non-steroidal anti-inflammatories (NSAID): Secondary | ICD-10-CM | POA: Insufficient documentation

## 2021-01-24 DIAGNOSIS — K1370 Unspecified lesions of oral mucosa: Secondary | ICD-10-CM | POA: Diagnosis present

## 2021-01-24 DIAGNOSIS — K1379 Other lesions of oral mucosa: Secondary | ICD-10-CM | POA: Insufficient documentation

## 2021-01-24 HISTORY — PX: EXCISION ORAL LESION WITH CO2 LASER: SHX6461

## 2021-01-24 SURGERY — DESTRUCTION, LESION, MOUTH, USING CO2 LASER
Anesthesia: General | Site: Mouth | Laterality: Left

## 2021-01-24 MED ORDER — LIDOCAINE-EPINEPHRINE 1 %-1:100000 IJ SOLN
INTRAMUSCULAR | Status: DC | PRN
  Administered 2021-01-24: 1 mL

## 2021-01-24 MED ORDER — FENTANYL CITRATE (PF) 100 MCG/2ML IJ SOLN: 25.0000 ug | INTRAMUSCULAR | Status: DC | PRN

## 2021-01-24 MED ORDER — LIDOCAINE HCL (PF) 2 % IJ SOLN
INTRAMUSCULAR | Status: AC
  Filled 2021-01-24: qty 5

## 2021-01-24 MED ORDER — FENTANYL CITRATE (PF) 100 MCG/2ML IJ SOLN
INTRAMUSCULAR | Status: AC
  Filled 2021-01-24: qty 2

## 2021-01-24 MED ORDER — SUGAMMADEX SODIUM 500 MG/5ML IV SOLN
INTRAVENOUS | Status: DC | PRN
  Administered 2021-01-24: 500 mg via INTRAVENOUS

## 2021-01-24 MED ORDER — LACTATED RINGERS IV SOLN: INTRAVENOUS | Status: DC

## 2021-01-24 MED ORDER — DEXAMETHASONE SODIUM PHOSPHATE 10 MG/ML IJ SOLN
INTRAMUSCULAR | Status: DC | PRN
  Administered 2021-01-24: 10 mg via INTRAVENOUS

## 2021-01-24 MED ORDER — EPHEDRINE SULFATE 50 MG/ML IJ SOLN
INTRAMUSCULAR | Status: DC | PRN
  Administered 2021-01-24: 10 mg via INTRAVENOUS

## 2021-01-24 MED ORDER — OXYCODONE HCL 5 MG PO TABS: 5.0000 mg | ORAL_TABLET | Freq: Once | ORAL | Status: DC | PRN

## 2021-01-24 MED ORDER — ONDANSETRON HCL 4 MG/2ML IJ SOLN
INTRAMUSCULAR | Status: AC
  Filled 2021-01-24: qty 2

## 2021-01-24 MED ORDER — ONDANSETRON HCL 4 MG/2ML IJ SOLN: 4.0000 mg | Freq: Once | INTRAMUSCULAR | Status: DC | PRN

## 2021-01-24 MED ORDER — DEXAMETHASONE SODIUM PHOSPHATE 10 MG/ML IJ SOLN
INTRAMUSCULAR | Status: AC
  Filled 2021-01-24: qty 1

## 2021-01-24 MED ORDER — ROCURONIUM BROMIDE 100 MG/10ML IV SOLN
INTRAVENOUS | Status: DC | PRN
  Administered 2021-01-24: 60 mg via INTRAVENOUS

## 2021-01-24 MED ORDER — SODIUM CHLORIDE (PF) 0.9 % IJ SOLN
INTRAMUSCULAR | Status: AC
  Filled 2021-01-24: qty 10

## 2021-01-24 MED ORDER — ONDANSETRON HCL 4 MG/2ML IJ SOLN
INTRAMUSCULAR | Status: DC | PRN
  Administered 2021-01-24: 4 mg via INTRAVENOUS

## 2021-01-24 MED ORDER — AMISULPRIDE (ANTIEMETIC) 5 MG/2ML IV SOLN: 10.0000 mg | Freq: Once | INTRAVENOUS | Status: DC | PRN

## 2021-01-24 MED ORDER — PROPOFOL 10 MG/ML IV BOLUS
INTRAVENOUS | Status: DC | PRN
  Administered 2021-01-24: 140 mg via INTRAVENOUS

## 2021-01-24 MED ORDER — OXYCODONE HCL 5 MG/5ML PO SOLN: 5.0000 mg | Freq: Once | ORAL | Status: DC | PRN

## 2021-01-24 MED ORDER — ROCURONIUM BROMIDE 10 MG/ML (PF) SYRINGE
PREFILLED_SYRINGE | INTRAVENOUS | Status: AC
  Filled 2021-01-24: qty 10

## 2021-01-24 MED ORDER — LIDOCAINE 2% (20 MG/ML) 5 ML SYRINGE
INTRAMUSCULAR | Status: DC | PRN
  Administered 2021-01-24: 60 mg via INTRAVENOUS

## 2021-01-24 SURGICAL SUPPLY — 40 items
BLADE SURG 15 STRL LF DISP TIS (BLADE) IMPLANT
BLADE SURG 15 STRL SS (BLADE) ×2
BNDG EYE OVAL (GAUZE/BANDAGES/DRESSINGS) ×4 IMPLANT
CANISTER SUCT 1200ML W/VALVE (MISCELLANEOUS) ×2 IMPLANT
COAGULATOR SUCT SWTCH 10FR 6 (ELECTROSURGICAL) IMPLANT
COVER MAYO STAND STRL (DRAPES) ×2 IMPLANT
DEPRESSOR TONGUE BLADE STERILE (MISCELLANEOUS) ×1 IMPLANT
ELECT COATED BLADE 2.86 ST (ELECTRODE) IMPLANT
ELECT REM PT RETURN 9FT ADLT (ELECTROSURGICAL)
ELECTRODE REM PT RTRN 9FT ADLT (ELECTROSURGICAL) IMPLANT
GAUZE SPONGE 4X4 12PLY STRL LF (GAUZE/BANDAGES/DRESSINGS) IMPLANT
GLOVE SURG LTX SZ7.5 (GLOVE) ×2 IMPLANT
GLOVE SURG POLYISO LF SZ8 (GLOVE) ×1 IMPLANT
GOWN STRL REUS W/ TWL LRG LVL3 (GOWN DISPOSABLE) ×1 IMPLANT
GOWN STRL REUS W/ TWL XL LVL3 (GOWN DISPOSABLE) ×1 IMPLANT
GOWN STRL REUS W/TWL 2XL LVL3 (GOWN DISPOSABLE) ×1 IMPLANT
GOWN STRL REUS W/TWL LRG LVL3 (GOWN DISPOSABLE)
GOWN STRL REUS W/TWL XL LVL3 (GOWN DISPOSABLE) ×2
MARKER SKIN DUAL TIP RULER LAB (MISCELLANEOUS) IMPLANT
NDL HYPO 30GX1 BEV (NEEDLE) IMPLANT
NDL PRECISIONGLIDE 27X1.5 (NEEDLE) ×1 IMPLANT
NEEDLE HYPO 30GX1 BEV (NEEDLE) IMPLANT
NEEDLE PRECISIONGLIDE 27X1.5 (NEEDLE) ×2 IMPLANT
NS IRRIG 1000ML POUR BTL (IV SOLUTION) ×2 IMPLANT
PACK BASIN DAY SURGERY FS (CUSTOM PROCEDURE TRAY) ×1 IMPLANT
PATTIES SURGICAL .5 X3 (DISPOSABLE) IMPLANT
PENCIL FOOT CONTROL (ELECTRODE) IMPLANT
PUNCH BIOPSY DERMAL 4MM (MISCELLANEOUS) ×1 IMPLANT
SHEET MEDIUM DRAPE 40X70 STRL (DRAPES) ×2 IMPLANT
SLEEVE SCD COMPRESS KNEE MED (STOCKING) ×1 IMPLANT
SUCTION FRAZIER HANDLE 12FR (TUBING) ×2
SUCTION TUBE FRAZIER 12FR DISP (TUBING) IMPLANT
SUT CHROMIC 4 0 P 3 18 (SUTURE) IMPLANT
SUT SILK 3 0 PS 1 (SUTURE) IMPLANT
SUT VIC AB 3-0 SH 27 (SUTURE)
SUT VIC AB 3-0 SH 27X BRD (SUTURE) IMPLANT
SYR BULB EAR ULCER 3OZ GRN STR (SYRINGE) IMPLANT
SYR CONTROL 10ML LL (SYRINGE) ×1 IMPLANT
TOWEL GREEN STERILE FF (TOWEL DISPOSABLE) ×2 IMPLANT
TUBE CONNECTING 20X1/4 (TUBING) ×2 IMPLANT

## 2021-01-24 NOTE — Interval H&P Note (Signed)
History and Physical Interval Note:  01/24/2021 8:41 AM  Megan Moses  has presented today for surgery, with the diagnosis of Oral Lesion.  The various methods of treatment have been discussed with the patient and family. After consideration of risks, benefits and other options for treatment, the patient has consented to  Procedure(s): EXCISION OF ORAL LESION with CO2 Laser (N/A) as a surgical intervention.  The patient's history has been reviewed, patient examined, no change in status, stable for surgery.  I have reviewed the patient's chart and labs.  Questions were answered to the patient's satisfaction.     Serena Colonel

## 2021-01-24 NOTE — Anesthesia Postprocedure Evaluation (Signed)
Anesthesia Post Note  Patient: Megan Moses  Procedure(s) Performed: EXCISION OF ORAL LESION with CO2 Laser (Left: Mouth)     Patient location during evaluation: PACU Anesthesia Type: General Level of consciousness: awake and alert Pain management: pain level controlled Vital Signs Assessment: post-procedure vital signs reviewed and stable Respiratory status: spontaneous breathing, nonlabored ventilation and respiratory function stable Cardiovascular status: blood pressure returned to baseline and stable Postop Assessment: no apparent nausea or vomiting Anesthetic complications: no   No notable events documented.  Last Vitals:  Vitals:   01/24/21 1035 01/24/21 1041  BP: 121/62   Pulse: 77 74  Resp: 12 13  Temp:    SpO2: 94% 99%    Last Pain:  Vitals:   01/24/21 1041  TempSrc:   PainSc: 0-No pain                 Lucretia Kern

## 2021-01-24 NOTE — Op Note (Signed)
OPERATIVE REPORT  DATE OF SURGERY: 01/24/2021  PATIENT:  Megan Moses,  75 y.o. female  PRE-OPERATIVE DIAGNOSIS:  Oral Lesion  POST-OPERATIVE DIAGNOSIS:  Oral Lesion  PROCEDURE:  Procedure(s): EXCISION OF ORAL LESION with CO2 Laser  SURGEON:  Susy Frizzle, MD  ASSISTANTS: None  ANESTHESIA:   General   EBL: Less than 10 ml  DRAINS: None  LOCAL MEDICATIONS USED: 1% Xylocaine with epinephrine  SPECIMEN: Left hard palate alveolar mucosal biopsy  COUNTS:  Correct  PROCEDURE DETAILS: The patient was taken to the operating room and placed on the operating table in the supine position. Following induction of general endotracheal anesthesia, patient was draped in standard fashion.  Saline soaked eye pads and wet towels were placed around the face for laser protection.  A laser safe endotracheal tube was used.  The oral cavity was inspected and the abnormal mucosal lesion was identified along the medial aspect of the hard palate approximately at the premolar.  There was some abnormal mucosa also in the lateral aspect.  Both areas were locally infiltrated.  A 4 mm dermal punch was used to biopsy the medial portion.  This was sent for pathologic valuation.  The CO2 laser with a handpiece set at 3 W continuous power was then used to ablate the abnormal mucosa.  This was done medially and laterally.  There was minimal bleeding.  Patient was then awakened extubated and transferred to recovery in stable condition    PATIENT DISPOSITION:  To PACU, stable

## 2021-01-24 NOTE — Discharge Instructions (Addendum)
Resume normal diet as tolerated.  Rinse mouth with salt water several times daily.  Brush teeth as you normally do.  Call your surgeon if you experience:   1.  Fever over 101.0. 2.  Inability to urinate. 3.  Nausea and/or vomiting. 4.  Extreme swelling or bruising at the surgical site. 5.  Continued bleeding from the incision. 6.  Increased pain, redness or drainage from the incision. 7.  Problems related to your pain medication. 8.  Any problems and/or concerns    Post Anesthesia Home Care Instructions  Activity: Get plenty of rest for the remainder of the day. A responsible individual must stay with you for 24 hours following the procedure.  For the next 24 hours, DO NOT: -Drive a car -Advertising copywriter -Drink alcoholic beverages -Take any medication unless instructed by your physician -Make any legal decisions or sign important papers.  Meals: Start with liquid foods such as gelatin or soup. Progress to regular foods as tolerated. Avoid greasy, spicy, heavy foods. If nausea and/or vomiting occur, drink only clear liquids until the nausea and/or vomiting subsides. Call your physician if vomiting continues.  Special Instructions/Symptoms: Your throat may feel dry or sore from the anesthesia or the breathing tube placed in your throat during surgery. If this causes discomfort, gargle with warm salt water. The discomfort should disappear within 24 hours.  If you had a scopolamine patch placed behind your ear for the management of post- operative nausea and/or vomiting:  1. The medication in the patch is effective for 72 hours, after which it should be removed.  Wrap patch in a tissue and discard in the trash. Wash hands thoroughly with soap and water. 2. You may remove the patch earlier than 72 hours if you experience unpleasant side effects which may include dry mouth, dizziness or visual disturbances. 3. Avoid touching the patch. Wash your hands with soap and water after contact  with the patch.

## 2021-01-24 NOTE — Anesthesia Procedure Notes (Signed)
Procedure Name: Intubation Date/Time: 01/24/2021 9:26 AM Performed by: Lavonia Dana, CRNA Pre-anesthesia Checklist: Patient identified, Emergency Drugs available, Suction available and Patient being monitored Patient Re-evaluated:Patient Re-evaluated prior to induction Oxygen Delivery Method: Circle system utilized Preoxygenation: Pre-oxygenation with 100% oxygen Induction Type: IV induction Ventilation: Mask ventilation without difficulty and Oral airway inserted - appropriate to patient size Laryngoscope Size: Mac and 3 Grade View: Grade I Tube type: Oral Laser Tube: Laser Tube and Cuffed inflated with minimal occlusive pressure - saline Tube size: 5.5 mm Number of attempts: 1 Airway Equipment and Method: Oral airway Placement Confirmation: ETT inserted through vocal cords under direct vision, positive ETCO2 and breath sounds checked- equal and bilateral Tube secured with: Tape Dental Injury: Teeth and Oropharynx as per pre-operative assessment

## 2021-01-24 NOTE — Transfer of Care (Signed)
Immediate Anesthesia Transfer of Care Note  Patient: Megan Moses  Procedure(s) Performed: EXCISION OF ORAL LESION with CO2 Laser (Left: Mouth)  Patient Location: PACU  Anesthesia Type:General  Level of Consciousness: awake, alert  and oriented   Airway & Oxygen Therapy: Patient Spontanous Breathing and Patient connected to face mask oxygen  Post-op Assessment: Report given to RN and Post -op Vital signs reviewed and stable  Post vital signs: Reviewed and stable  Last Vitals:  Vitals Value Taken Time  BP    Temp    Pulse    Resp    SpO2      Last Pain:  Vitals:   01/24/21 0725  TempSrc: Oral  PainSc: 0-No pain         Complications: No notable events documented.

## 2021-01-24 NOTE — Anesthesia Preprocedure Evaluation (Signed)
Anesthesia Evaluation  Patient identified by MRN, date of birth, ID band Patient awake    Reviewed: Allergy & Precautions, NPO status , Patient's Chart, lab work & pertinent test results  History of Anesthesia Complications Negative for: history of anesthetic complications  Airway Mallampati: II  TM Distance: >3 FB Neck ROM: Full    Dental  (+) Dental Advisory Given, Teeth Intact   Pulmonary neg pulmonary ROS,    Pulmonary exam normal        Cardiovascular hypertension, Normal cardiovascular exam     Neuro/Psych negative neurological ROS     GI/Hepatic negative GI ROS, Neg liver ROS,   Endo/Other  negative endocrine ROS  Renal/GU negative Renal ROS  negative genitourinary   Musculoskeletal negative musculoskeletal ROS (+)   Abdominal   Peds  Hematology negative hematology ROS (+)   Anesthesia Other Findings   Reproductive/Obstetrics                             Anesthesia Physical Anesthesia Plan  ASA: 2  Anesthesia Plan: General   Post-op Pain Management:    Induction: Intravenous  PONV Risk Score and Plan: 3 and Ondansetron, Dexamethasone, Treatment may vary due to age or medical condition and Midazolam  Airway Management Planned: Oral ETT  Additional Equipment: None  Intra-op Plan:   Post-operative Plan: Extubation in OR  Informed Consent: I have reviewed the patients History and Physical, chart, labs and discussed the procedure including the risks, benefits and alternatives for the proposed anesthesia with the patient or authorized representative who has indicated his/her understanding and acceptance.     Dental advisory given  Plan Discussed with:   Anesthesia Plan Comments:         Anesthesia Quick Evaluation

## 2021-01-26 ENCOUNTER — Encounter (HOSPITAL_BASED_OUTPATIENT_CLINIC_OR_DEPARTMENT_OTHER): Payer: Self-pay | Admitting: Otolaryngology

## 2021-01-27 LAB — SURGICAL PATHOLOGY

## 2021-02-18 ENCOUNTER — Other Ambulatory Visit: Payer: Self-pay | Admitting: Family Medicine

## 2021-02-18 DIAGNOSIS — Z1231 Encounter for screening mammogram for malignant neoplasm of breast: Secondary | ICD-10-CM

## 2021-04-11 ENCOUNTER — Other Ambulatory Visit: Payer: Self-pay

## 2021-04-11 ENCOUNTER — Ambulatory Visit
Admission: RE | Admit: 2021-04-11 | Discharge: 2021-04-11 | Disposition: A | Discharge status: Home / Self Care | Payer: Medicare HMO | Source: Ambulatory Visit | Attending: Family Medicine | Admitting: Family Medicine

## 2021-04-11 DIAGNOSIS — Z1231 Encounter for screening mammogram for malignant neoplasm of breast: Secondary | ICD-10-CM

## 2022-03-28 ENCOUNTER — Other Ambulatory Visit: Payer: Self-pay | Admitting: Family Medicine

## 2022-03-28 DIAGNOSIS — Z13820 Encounter for screening for osteoporosis: Secondary | ICD-10-CM

## 2022-03-28 DIAGNOSIS — Z1231 Encounter for screening mammogram for malignant neoplasm of breast: Secondary | ICD-10-CM

## 2022-05-22 ENCOUNTER — Ambulatory Visit
Admission: RE | Admit: 2022-05-22 | Discharge: 2022-05-22 | Disposition: A | Discharge status: Home / Self Care | Payer: Medicare HMO | Source: Ambulatory Visit | Attending: Family Medicine | Admitting: Family Medicine

## 2022-05-22 DIAGNOSIS — Z1231 Encounter for screening mammogram for malignant neoplasm of breast: Secondary | ICD-10-CM

## 2022-09-05 ENCOUNTER — Ambulatory Visit
Admission: RE | Admit: 2022-09-05 | Discharge: 2022-09-05 | Disposition: A | Discharge status: Home / Self Care | Payer: Medicare HMO | Source: Ambulatory Visit | Attending: Family Medicine | Admitting: Family Medicine

## 2022-09-05 DIAGNOSIS — Z13820 Encounter for screening for osteoporosis: Secondary | ICD-10-CM

## 2023-04-12 IMAGING — CT CT NECK W/ CM
2 of 5 series · 4 of 14 positions shown, 5 images · IV contrast (iopamidol)
Comparison: None.

CLINICAL DATA: Soreness of the right neck. Fullness of the right
side. Question mass versus para spinous muscle. Skin cancer with
excision.

Creatinine was obtained on site at [HOSPITAL] at [HOSPITAL].
Results: Creatinine 0.8 mg/dL.
EXAM:
CT NECK WITH CONTRAST
TECHNIQUE: Multidetector CT imaging of the neck was performed using the
standard protocol following the bolus administration of intravenous
contrast.
CONTRAST:  75mL [id number] IOPAMIDOL ([id number]) INJECTION 61%

[Series 4: bone · axial · 0.42mm/px · z∈[+845,+923]mm · 2 of 117 slices shown]
[im 39/117  bone]
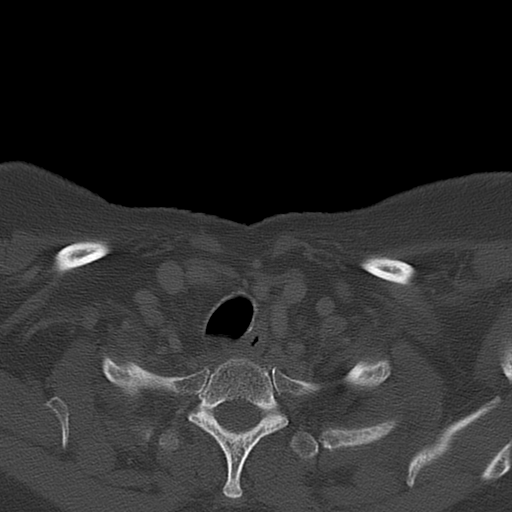
[im 78/117  bone]
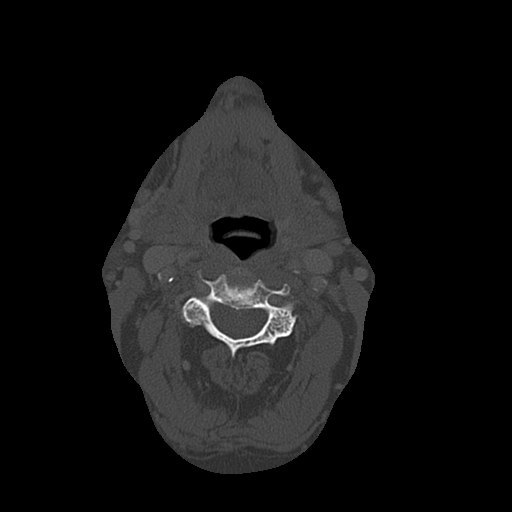

[Series 7: angled axial-oropharynx · axial · 0.39mm/px · z∈[+821,+897]mm · 2 of 118 slices shown, 3 images]
[im 40/118  soft-tissue]
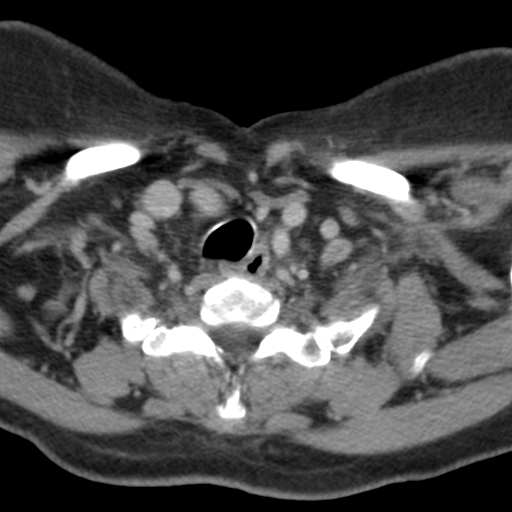
[im 40/118  bone]
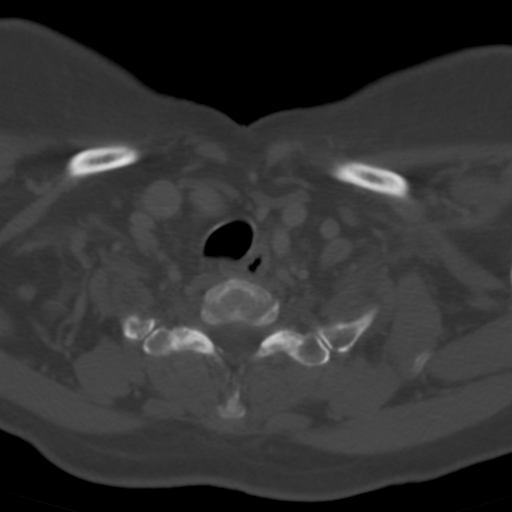
[im 79/118  bone]
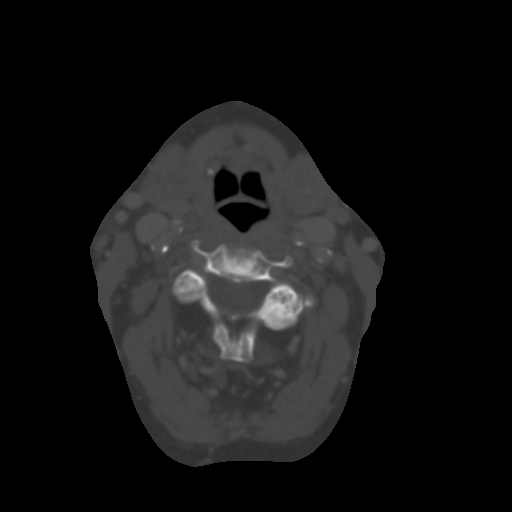

[4 of 14 positions shown; findings below may reference images not displayed]

FINDINGS: Pharynx and larynx: No evidence of mucosal or submucosal lesion.

Salivary glands: Parotid and submandibular glands are symmetric and
normal.

Thyroid: Enlarged heterogeneous right thyroid lobe. Largest
measurable nodule 1.5 cm.

Lymph nodes: No enlarged or low-density nodes on either side of the
neck. No evidence of soft tissue mass with specific attention to the
right side of the neck.

Vascular: Atherosclerotic calcification at both carotid
bifurcations.

Limited intracranial: Normal

Visualized orbits: Not included

Mastoids and visualized paranasal sinuses: Clear

Skeleton: Degenerative cervical spondylosis and facet arthritis.

Upper chest: Lung apices are clear. No superior mediastinal
pathology.

Other: None
IMPRESSION: 1. No abnormality seen to explain the clinical presentation. No
evidence of mass or lymphadenopathy.
2. Enlarged heterogeneous right thyroid lobe. Largest measurable
nodule 1.5 cm. Recommend thyroid US (ref: [HOSPITAL]. 4410

## 2023-04-26 IMAGING — US US THYROID
1 series · 12 of 25 positions shown · non-contrast
Comparison: None.

CLINICAL DATA: 74-year-old female with a history of thyroid nodule

EXAM:
THYROID ULTRASOUND
TECHNIQUE: Ultrasound examination of the thyroid gland and adjacent soft
tissues was performed.

[Series 1: us thyroid · 0.05mm/px · 12 of 41 slices shown]
[im 2/41]
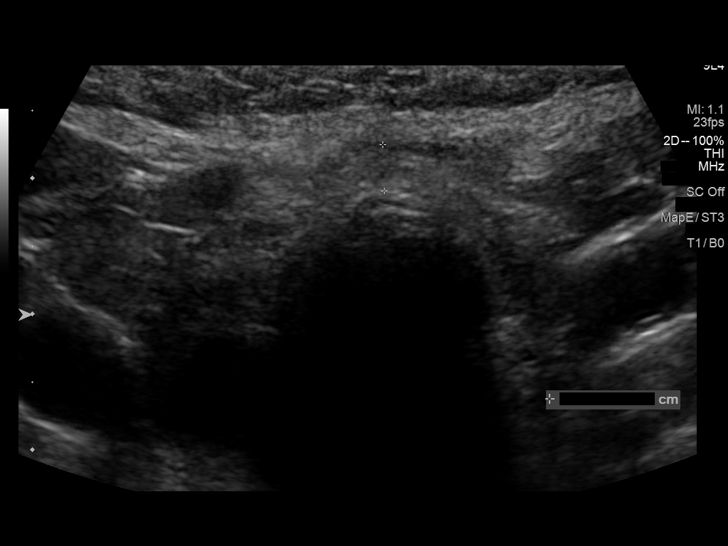
[im 6/41]
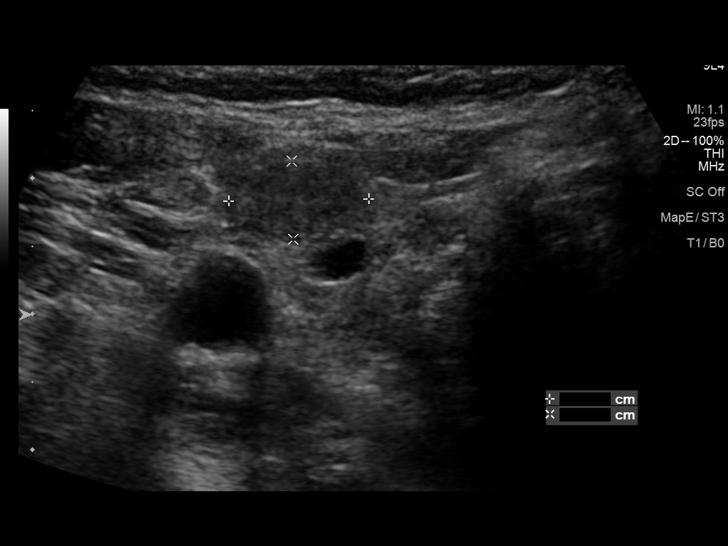
[im 9/41]
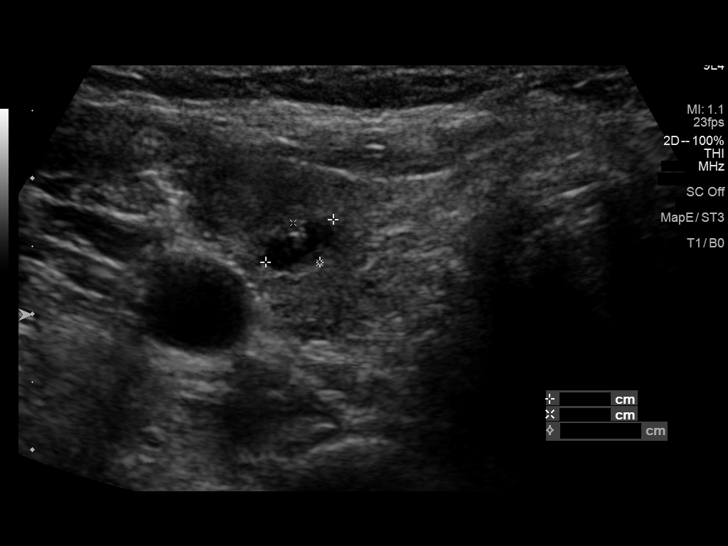
[im 12/41]
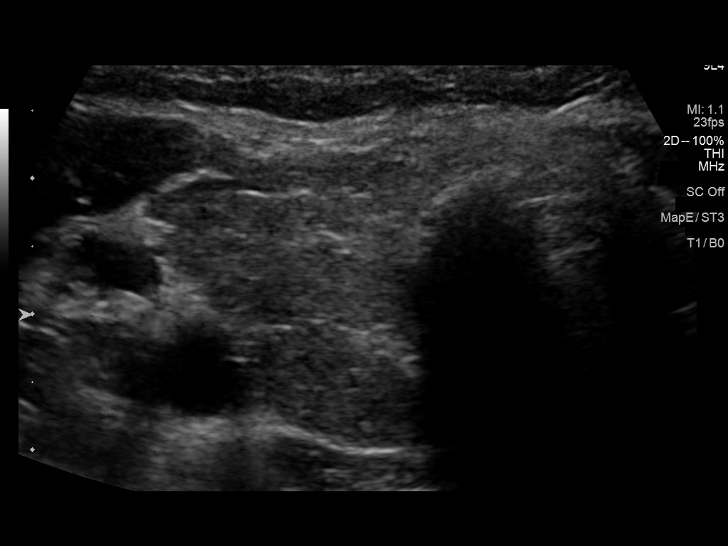
[im 16/41]
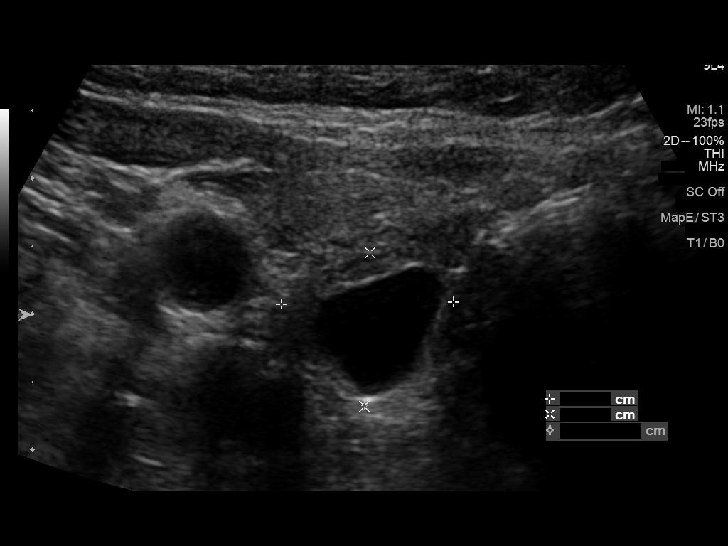
[im 19/41]
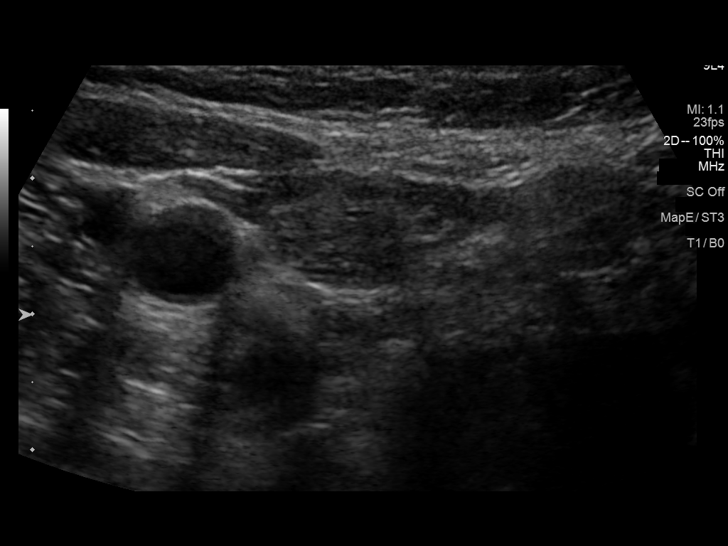
[im 22/41]
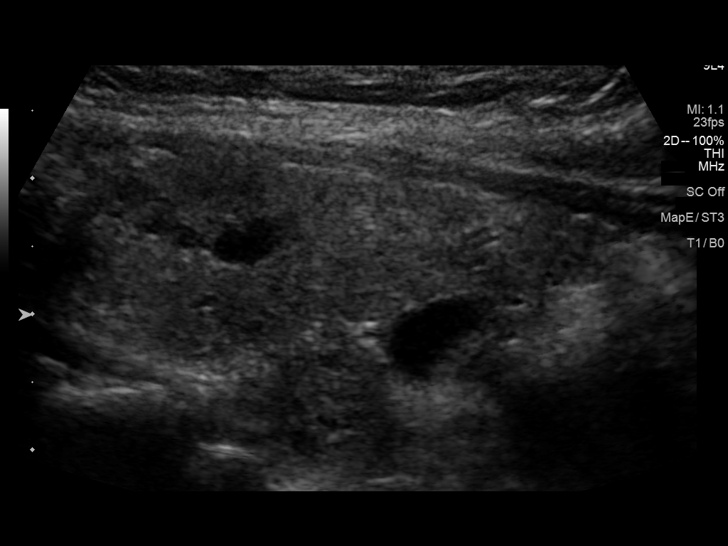
[im 26/41]
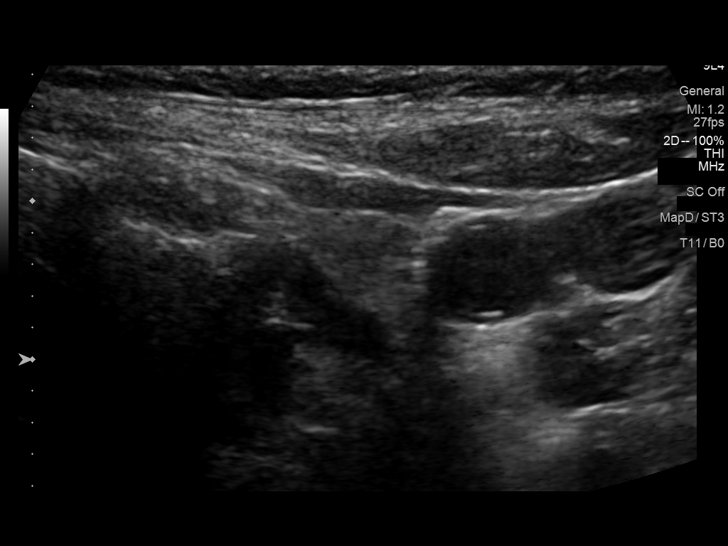
[im 29/41]
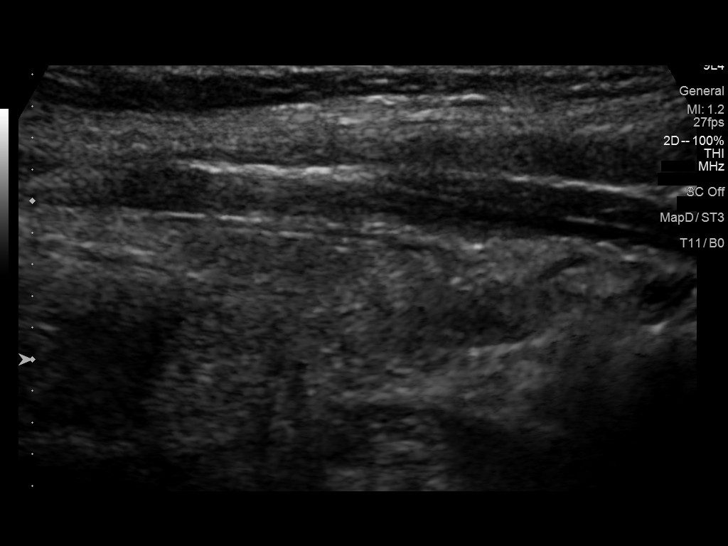
[im 32/41]
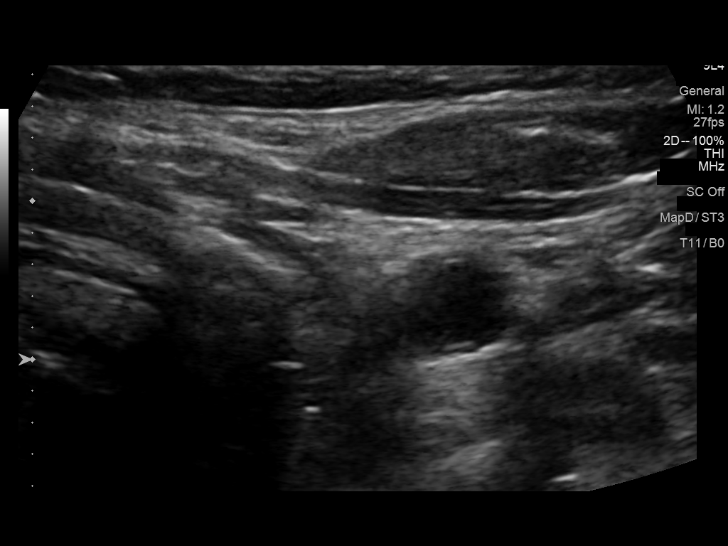
[im 36/41]
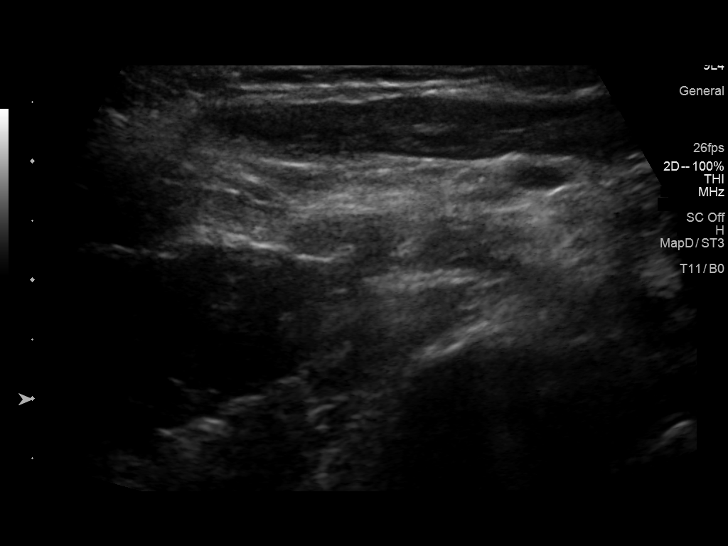
[im 39/41]
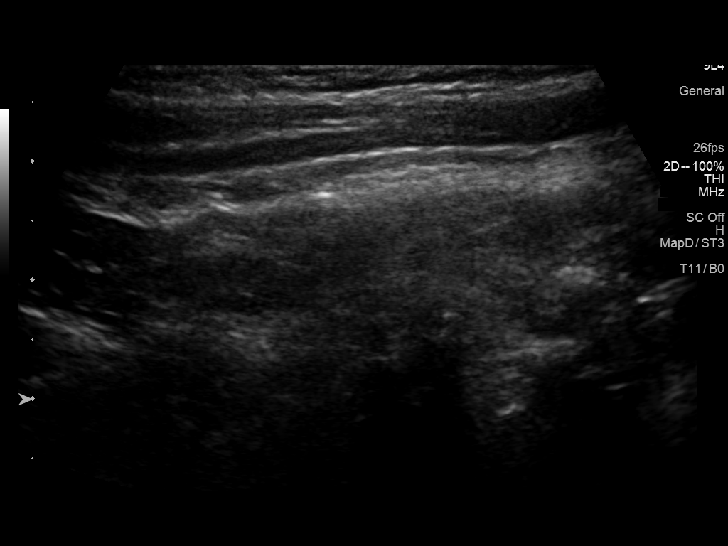

[12 of 25 positions shown; findings below may reference images not displayed]

FINDINGS: Parenchymal Echotexture: Mildly heterogenous

Isthmus: 0.3 cm

Right lobe: 4.3 cm x 2.4 cm x 1.8 cm

Left lobe: 3.4 cm x 1.1 cm x 1.1 cm

_________________________________________________________

Estimated total number of nodules >/= 1 cm: 3

Number of spongiform nodules >/=  2 cm not described below (TR1): 0

Number of mixed cystic and solid nodules >/= 1.5 cm not described
below (TR2): 0

_________________________________________________________

Nodule # 1:

Location: Right; Superior

Maximum size: 1.1 cm; Other 2 dimensions: 1.0 cm x 0.6 cm

Composition: cannot determine (2)

Echogenicity: isoechoic (1)

Shape: not taller-than-wide (0)

Margins: ill-defined (0)

Echogenic foci: none (0)

ACR TI-RADS total points: 3.

ACR TI-RADS risk category: TR3 (3 points).

ACR TI-RADS recommendations:

Nodule does not meet criteria for surveillance or biopsy

_________________________________________________________

Nodule # 2:

Location: Right; Superior

Maximum size: 0.6 cm; Other 2 dimensions: 0.6 cm x 0.4 cm

Composition: cystic/almost completely cystic (0)

Echogenicity: anechoic (0)

Shape: not taller-than-wide (0)

Margins: smooth (0)

Echogenic foci: none (0)

ACR TI-RADS total points: 0.

ACR TI-RADS risk category: TR1 (0-1 points).

ACR TI-RADS recommendations:

Cystic nodule does not meet criteria for surveillance or biopsy

_________________________________________________________

Nodule # 3:

Location: Right; Mid

Maximum size: 1.7 cm; Other 2 dimensions: 1.4 cm x 0.9 cm

Composition: solid/almost completely solid (2)

Echogenicity: isoechoic (1)

Shape: not taller-than-wide (0)

Margins: ill-defined (0)

Echogenic foci: none (0)

ACR TI-RADS total points: 3.

ACR TI-RADS risk category: TR3 (3 points).

ACR TI-RADS recommendations:

Nodule meets criteria for surveillance

_________________________________________________________

Nodule # 4:

Location: Right; Inferior

Maximum size: 1.3 cm; Other 2 dimensions: 1.3 cm x 1.1 cm

Composition: cystic/almost completely cystic (0)

Echogenicity: anechoic (0)

Shape: not taller-than-wide (0)

Margins: smooth (0)

Echogenic foci: none (0)

ACR TI-RADS total points: 0.

ACR TI-RADS risk category: TR1 (0-1 points).

ACR TI-RADS recommendations:

Cystic nodule does not meet criteria for surveillance or biopsy

_________________________________________________________

No adenopathy
IMPRESSION: Multinodular thyroid.

Right inferior thyroid nodule (labeled 3, 1.7 cm, TR 3) meets
criteria for surveillance, as designated by the newly established
ACR TI-RADS criteria. Surveillance ultrasound study recommended to
be performed annually up to 5 years.

Recommendations follow those established by the new ACR TI-RADS
criteria ([[facility]] [id number];[DATE]).

## 2023-07-09 ENCOUNTER — Other Ambulatory Visit: Payer: Self-pay | Admitting: Family Medicine

## 2023-07-09 DIAGNOSIS — Z0000 Encounter for general adult medical examination without abnormal findings: Secondary | ICD-10-CM

## 2023-08-01 ENCOUNTER — Ambulatory Visit: Payer: Medicare HMO

## 2023-09-19 ENCOUNTER — Ambulatory Visit: Payer: Medicare HMO

## 2023-10-03 ENCOUNTER — Ambulatory Visit
Admission: RE | Admit: 2023-10-03 | Discharge: 2023-10-03 | Disposition: A | Discharge status: Home / Self Care | Source: Ambulatory Visit | Attending: Family Medicine | Admitting: Family Medicine

## 2023-10-03 DIAGNOSIS — Z0000 Encounter for general adult medical examination without abnormal findings: Secondary | ICD-10-CM
